# Patient Record
Sex: Female | Born: 1955 | Race: White | Hispanic: No | Marital: Married | Smoking: Never smoker
Health system: Southern US, Community
[De-identification: ages and names within clinical notes are randomized; demographics above are authoritative.]

## PROBLEM LIST (undated history)

## (undated) DIAGNOSIS — I214 Non-ST elevation (NSTEMI) myocardial infarction: Secondary | ICD-10-CM

## (undated) HISTORY — PX: COLONOSCOPY: SHX174

## (undated) HISTORY — PX: FOOT SURGERY: SHX648

## (undated) HISTORY — PX: EYE SURGERY: SHX253

## (undated) HISTORY — PX: APPENDECTOMY: SHX54

---

## 2017-07-29 ENCOUNTER — Encounter: Payer: Self-pay | Admitting: Gynecology

## 2017-07-29 ENCOUNTER — Ambulatory Visit
Admission: EM | Admit: 2017-07-29 | Discharge: 2017-07-29 | Disposition: A | Discharge status: Home / Self Care | Payer: 59 | Attending: Family Medicine | Admitting: Family Medicine

## 2017-07-29 ENCOUNTER — Other Ambulatory Visit: Payer: Self-pay

## 2017-07-29 DIAGNOSIS — Z20828 Contact with and (suspected) exposure to other viral communicable diseases: Secondary | ICD-10-CM | POA: Diagnosis not present

## 2017-07-29 DIAGNOSIS — R0981 Nasal congestion: Secondary | ICD-10-CM

## 2017-07-29 DIAGNOSIS — J069 Acute upper respiratory infection, unspecified: Secondary | ICD-10-CM | POA: Diagnosis not present

## 2017-07-29 LAB — RAPID INFLUENZA A&B ANTIGENS
Influenza A (ARMC): NEGATIVE
Influenza B (ARMC): NEGATIVE

## 2017-07-29 LAB — RAPID STREP SCREEN (MED CTR MEBANE ONLY): Streptococcus, Group A Screen (Direct): NEGATIVE

## 2017-07-29 MED ORDER — CETIRIZINE HCL 10 MG PO TABS: 10.0000 mg | ORAL_TABLET | Freq: Every day | ORAL | 1 refills | Status: DC

## 2017-07-29 MED ORDER — BENZONATATE 100 MG PO CAPS: 100.0000 mg | ORAL_CAPSULE | Freq: Three times a day (TID) | ORAL | 0 refills | Status: DC

## 2017-07-29 MED ORDER — OSELTAMIVIR PHOSPHATE 75 MG PO CAPS: 75.0000 mg | ORAL_CAPSULE | Freq: Two times a day (BID) | ORAL | 0 refills | Status: DC

## 2017-07-29 NOTE — ED Provider Notes (Signed)
[facility]    CSN: [id number] Arrival date & time: 07/29/17  1643     History   Chief Complaint Chief Complaint  Patient presents with  . Sore Throat    HPI Tiffany Mcconnell is a 62 y.o. female.   Patient is a 62 year old female who presents with complaint of cough, chest congestion, scratchy throat, fatigue and congestion around her eyes.  She started feeling some symptoms last night but also started this morning.  Patient reports her daughter-in-law was diagnosed with the flu yesterday.  She states they were closely together at work.  Patient denies any body aches or fevers.  She denies any ear issues, abdominal pain, and nausea.  Again she does report a little bit of chest tightness that she attributes to her cough.  Patient states she had some congestion last week and feels like it may have moved down with up into her chest.  She has taken some Aleve to help with her symptoms.  She denies any seasonal allergy issues.      History reviewed. No pertinent past medical history.  There are no active problems to display for this patient.   Past Surgical History:  Procedure Laterality Date  . APPENDECTOMY    . FOOT SURGERY Left     OB History    No data available       Home Medications    Prior to Admission medications   Medication Sig Start Date End Date Taking? Authorizing Provider  benzonatate (TESSALON) 100 MG capsule Take 1 capsule (100 mg total) by mouth every 8 (eight) hours. 07/29/17   Candis SchatzHarris, Michael D, PA-C  cetirizine (ZYRTEC) 10 MG tablet Take 1 tablet (10 mg total) by mouth daily. 07/29/17   Candis SchatzHarris, Michael D, PA-C  oseltamivir (TAMIFLU) 75 MG capsule Take 1 capsule (75 mg total) by mouth every 12 (twelve) hours. 07/29/17   Candis SchatzHarris, Michael D, PA-C    Family History Family History  Problem Relation Age of Onset  . Clotting disorder Mother   . Heart failure Father     Social History Social History   Tobacco Use  . Smoking status: Never  Smoker  . Smokeless tobacco: Never Used  Substance Use Topics  . Alcohol use: No    Frequency: Never  . Drug use: No     Allergies   Codeine and Vicodin [hydrocodone-acetaminophen]   Review of Systems Review of Systems  As noted above in HPI.  Other systems reviewed and found to be negative.   Physical Exam Triage Vital Signs ED Triage Vitals  Enc Vitals Group     BP 07/29/17 1704 132/76     Pulse Rate 07/29/17 1704 94     Resp 07/29/17 1704 16     Temp 07/29/17 1704 98.4 F (36.9 C)     Temp Source 07/29/17 1704 Oral     SpO2 07/29/17 1704 100 %     Weight 07/29/17 1659 148 lb (67.1 kg)     Height 07/29/17 1659 5\' 7"  (1.702 m)     Head Circumference --      Peak Flow --      Pain Score 07/29/17 1659 0     Pain Loc --      Pain Edu? --      Excl. in GC? --    No data found.  Updated Vital Signs BP 132/76 (BP Location: Left Arm)   Pulse 94   Temp 98.4 F (36.9 C) (Oral)   Resp  16   Ht 5\' 7"  (1.702 m)   Wt 148 lb (67.1 kg)   SpO2 100%   BMI 23.18 kg/m    Physical Exam  Constitutional: She appears well-developed and well-nourished. She does not appear ill.  HENT:  Right Ear: Tympanic membrane and ear canal normal.  Left Ear: Tympanic membrane and ear canal normal.  Nose: Mucosal edema present. Right sinus exhibits frontal sinus tenderness. Right sinus exhibits no maxillary sinus tenderness. Left sinus exhibits frontal sinus tenderness. Left sinus exhibits no maxillary sinus tenderness.  Mouth/Throat: Tonsils are 0 on the right. Tonsils are 0 on the left.  Mild clear postnasal drainage.  Eyes: EOM are normal. Pupils are equal, round, and reactive to light.  Neck: Normal range of motion. Neck supple.  Cardiovascular: Normal rate and normal heart sounds.  No murmur heard. Pulmonary/Chest: Effort normal and breath sounds normal. No respiratory distress. She has no wheezes.  No cough or wheezing on forced expiration     UC Treatments / Results   Labs (all labs ordered are listed, but only abnormal results are displayed) Labs Reviewed  RAPID STREP SCREEN (NOT AT [facility])  RAPID INFLUENZA A&B ANTIGENS ([facility] ONLY)  CULTURE, GROUP A STREP [facility])    EKG  EKG Interpretation None       Radiology No results found.  Procedures Procedures (including critical care time)  Medications Ordered in UC Medications - No data to display   Initial Impression / Assessment and Plan / UC Course  I have reviewed the triage vital signs and the nursing notes.  Pertinent labs & imaging results that were available during my care of the patient were reviewed by me and considered in my medical decision making (see chart for details).     On exam patient with mild postnasal drainage as well as some nasal edema.  She also has some frontal maxillary sinus tenderness.  Given her symptoms starting mostly this morning with her known contact, however go ahead and give her a prescription for Tamiflu and recommend that she can start it over the next day or so should her symptoms worsen, including coming in with fever, body aches, and chills.  Patient also given prescription for Zyrtec as well as Tessalon for her cough.  Patient verbalized understanding is agreement of plan.  Final Clinical Impressions(s) / UC Diagnoses   Final diagnoses:  Upper respiratory tract infection, unspecified type  Nasal sinus congestion    ED Discharge Orders        Ordered    cetirizine (ZYRTEC) 10 MG tablet  Daily     07/29/17 1803    oseltamivir (TAMIFLU) 75 MG capsule  Every 12 hours     07/29/17 1803    benzonatate (TESSALON) 100 MG capsule  Every 8 hours     07/29/17 1803       Controlled Substance Prescriptions [place] Controlled Substance Registry consulted? Not Applicable   Candis Schatz, PA-C 07/29/17 2014

## 2017-07-29 NOTE — Discharge Instructions (Signed)
-  Zyrtec: one tablet daily -tessalon: one every 8 hours as needed for cough -Tamiflu: can start in next day or two if symptoms worsen -rest and fluids -ibuprofen/naproxen or Tylenol as needed for pain and fever -follow up with PCP as needed

## 2017-07-29 NOTE — ED Triage Notes (Signed)
Patient c/o scratchy throat / cough and congestion.

## 2017-08-01 LAB — CULTURE, GROUP A STREP (THRC)

## 2018-08-05 ENCOUNTER — Encounter: Payer: Self-pay | Admitting: *Deleted

## 2018-08-05 ENCOUNTER — Other Ambulatory Visit: Payer: Self-pay

## 2018-08-12 NOTE — Discharge Instructions (Signed)

## 2018-08-18 ENCOUNTER — Encounter
Admission: RE | Disposition: A | Discharge status: Home / Self Care | Payer: Self-pay | Source: Home / Self Care | Attending: Ophthalmology

## 2018-08-18 ENCOUNTER — Other Ambulatory Visit: Payer: Self-pay

## 2018-08-18 ENCOUNTER — Ambulatory Visit: Payer: BLUE CROSS/BLUE SHIELD | Admitting: Anesthesiology

## 2018-08-18 ENCOUNTER — Ambulatory Visit
Admission: RE | Admit: 2018-08-18 | Discharge: 2018-08-18 | Disposition: A | Discharge status: Home / Self Care | Payer: BLUE CROSS/BLUE SHIELD | Attending: Ophthalmology | Admitting: Ophthalmology

## 2018-08-18 DIAGNOSIS — H2512 Age-related nuclear cataract, left eye: Secondary | ICD-10-CM | POA: Diagnosis not present

## 2018-08-18 HISTORY — PX: CATARACT EXTRACTION W/PHACO: SHX586

## 2018-08-18 SURGERY — PHACOEMULSIFICATION, CATARACT, WITH IOL INSERTION
Anesthesia: Monitor Anesthesia Care | Site: Eye | Laterality: Left

## 2018-08-18 MED ORDER — FENTANYL CITRATE (PF) 100 MCG/2ML IJ SOLN
INTRAMUSCULAR | Status: DC | PRN
  Administered 2018-08-18: 100 ug via INTRAVENOUS

## 2018-08-18 MED ORDER — CEFUROXIME OPHTHALMIC INJECTION 1 MG/0.1 ML
INJECTION | OPHTHALMIC | Status: DC | PRN
  Administered 2018-08-18: 0.1 mL via INTRACAMERAL

## 2018-08-18 MED ORDER — LIDOCAINE HCL (PF) 2 % IJ SOLN
INTRAOCULAR | Status: DC | PRN
  Administered 2018-08-18: 1 mL

## 2018-08-18 MED ORDER — ONDANSETRON HCL 4 MG/2ML IJ SOLN: 4.0000 mg | Freq: Once | INTRAMUSCULAR | Status: DC | PRN

## 2018-08-18 MED ORDER — MOXIFLOXACIN HCL 0.5 % OP SOLN
1.0000 [drp] | OPHTHALMIC | Status: DC | PRN
  Administered 2018-08-18 (×3): 1 [drp] via OPHTHALMIC

## 2018-08-18 MED ORDER — ONDANSETRON HCL 4 MG/2ML IJ SOLN
INTRAMUSCULAR | Status: DC | PRN
  Administered 2018-08-18: 4 mg via INTRAVENOUS

## 2018-08-18 MED ORDER — NA HYALUR & NA CHOND-NA HYALUR 0.4-0.35 ML IO KIT
PACK | INTRAOCULAR | Status: DC | PRN
  Administered 2018-08-18: 1 mL via INTRAOCULAR

## 2018-08-18 MED ORDER — EPINEPHRINE PF 1 MG/ML IJ SOLN
INTRAOCULAR | Status: DC | PRN
  Administered 2018-08-18: 51 mL via OPHTHALMIC

## 2018-08-18 MED ORDER — MIDAZOLAM HCL 2 MG/2ML IJ SOLN
INTRAMUSCULAR | Status: DC | PRN
  Administered 2018-08-18: 2 mg via INTRAVENOUS

## 2018-08-18 MED ORDER — ARMC OPHTHALMIC DILATING DROPS
1.0000 "application " | OPHTHALMIC | Status: DC | PRN
  Administered 2018-08-18 (×3): 1 via OPHTHALMIC

## 2018-08-18 MED ORDER — BRIMONIDINE TARTRATE-TIMOLOL 0.2-0.5 % OP SOLN
OPHTHALMIC | Status: DC | PRN
  Administered 2018-08-18: 1 [drp] via OPHTHALMIC

## 2018-08-18 MED ORDER — TETRACAINE HCL 0.5 % OP SOLN
1.0000 [drp] | OPHTHALMIC | Status: DC | PRN
  Administered 2018-08-18 (×3): 1 [drp] via OPHTHALMIC

## 2018-08-18 MED ORDER — LACTATED RINGERS IV SOLN: 10.0000 mL/h | INTRAVENOUS | Status: DC

## 2018-08-18 SURGICAL SUPPLY — 18 items
CANNULA ANT/CHMB 27GA (MISCELLANEOUS) ×3 IMPLANT
GLOVE SURG LX 7.5 STRW (GLOVE) ×2
GLOVE SURG LX STRL 7.5 STRW (GLOVE) ×1 IMPLANT
GLOVE SURG TRIUMPH 8.0 PF LTX (GLOVE) ×3 IMPLANT
GOWN STRL REUS W/ TWL LRG LVL3 (GOWN DISPOSABLE) ×2 IMPLANT
GOWN STRL REUS W/TWL LRG LVL3 (GOWN DISPOSABLE) ×4
LENS IOL TECNIS ITEC 22.0 (Intraocular Lens) ×3 IMPLANT
MARKER SKIN DUAL TIP RULER LAB (MISCELLANEOUS) ×3 IMPLANT
NEEDLE FILTER BLUNT 18X 1/2SAF (NEEDLE) ×2
NEEDLE FILTER BLUNT 18X1 1/2 (NEEDLE) ×1 IMPLANT
PACK CATARACT BRASINGTON (MISCELLANEOUS) ×3 IMPLANT
PACK EYE AFTER SURG (MISCELLANEOUS) ×3 IMPLANT
PACK OPTHALMIC (MISCELLANEOUS) ×3 IMPLANT
SYR 3ML LL SCALE MARK (SYRINGE) ×3 IMPLANT
SYR 5ML LL (SYRINGE) ×3 IMPLANT
SYR TB 1ML LUER SLIP (SYRINGE) ×3 IMPLANT
WATER STERILE IRR 500ML POUR (IV SOLUTION) ×3 IMPLANT
WIPE NON LINTING 3.25X3.25 (MISCELLANEOUS) ×3 IMPLANT

## 2018-08-18 NOTE — Transfer of Care (Signed)
Immediate Anesthesia Transfer of Care Note  Patient: Tiffany Mcconnell  Procedure(s) Performed: CATARACT EXTRACTION PHACO AND INTRAOCULAR LENS PLACEMENT (IOC)  LEFT (Left Eye)  Patient Location: PACU  Anesthesia Type: MAC  Level of Consciousness: awake, alert  and patient cooperative  Airway and Oxygen Therapy: Patient Spontanous Breathing and Patient connected to supplemental oxygen  Post-op Assessment: Post-op Vital signs reviewed, Patient's Cardiovascular Status Stable, Respiratory Function Stable, Patent Airway and No signs of Nausea or vomiting  Post-op Vital Signs: Reviewed and stable  Complications: No apparent anesthesia complications

## 2018-08-18 NOTE — Op Note (Signed)
OPERATIVE NOTE  Tiffany Mcconnell [id number] 08/18/2018   PREOPERATIVE DIAGNOSIS:  Nuclear sclerotic cataract left eye. H25.12   POSTOPERATIVE DIAGNOSIS:    Nuclear sclerotic cataract left eye.     PROCEDURE:  Phacoemusification with posterior chamber intraocular lens placement of the left eye   LENS:   Implant Name Type Inv. Item Serial No. Manufacturer Lot No. LRB No. Used  LENS IOL DIOP 22.0 - [id number] Intraocular Lens LENS IOL DIOP 22.0 [id number] AMO  Left 1        ULTRASOUND TIME: 17  % of 0 minutes 33 seconds, CDE 5.8  SURGEON:  Deirdre Evener, MD   ANESTHESIA:  Topical with tetracaine drops and 2% Xylocaine jelly, augmented with 1% preservative-free intracameral lidocaine.    COMPLICATIONS:  None.   DESCRIPTION OF PROCEDURE:  The patient was identified in the holding room and transported to the operating room and placed in the supine position under the operating microscope.  The left eye was identified as the operative eye and it was prepped and draped in the usual sterile ophthalmic fashion.   A 1 millimeter clear-corneal paracentesis was made at the 1:30 position.  0.5 ml of preservative-free 1% lidocaine was injected into the anterior chamber.  The anterior chamber was filled with Viscoat viscoelastic.  A 2.4 millimeter keratome was used to make a near-clear corneal incision at the 10:30 position.  .  A curvilinear capsulorrhexis was made with a cystotome and capsulorrhexis forceps.  Balanced salt solution was used to hydrodissect and hydrodelineate the nucleus.   Phacoemulsification was then used in stop and chop fashion to remove the lens nucleus and epinucleus.  The remaining cortex was then removed using the irrigation and aspiration handpiece. Provisc was then placed into the capsular bag to distend it for lens placement.  A lens was then injected into the capsular bag.  The remaining viscoelastic was aspirated.   Wounds were hydrated with balanced salt  solution.  The anterior chamber was inflated to a physiologic pressure with balanced salt solution.  No wound leaks were noted. Cefuroxime 0.1 ml of a 10mg /ml solution was injected into the anterior chamber for a dose of 1 mg of intracameral antibiotic at the completion of the case.   Timolol and Brimonidine drops were applied to the eye.  The patient was taken to the recovery room in stable condition without complications of anesthesia or surgery.  [name] 08/18/2018, 11:25 AM

## 2018-08-18 NOTE — Anesthesia Postprocedure Evaluation (Signed)
Anesthesia Post Note  Patient: Tiffany Mcconnell  Procedure(s) Performed: CATARACT EXTRACTION PHACO AND INTRAOCULAR LENS PLACEMENT (IOC)  LEFT (Left Eye)  Patient location during evaluation: PACU Anesthesia Type: MAC Level of consciousness: awake and alert and oriented Pain management: satisfactory to patient Vital Signs Assessment: post-procedure vital signs reviewed and stable Respiratory status: spontaneous breathing, nonlabored ventilation and respiratory function stable Cardiovascular status: blood pressure returned to baseline and stable Postop Assessment: Adequate PO intake and No signs of nausea or vomiting Anesthetic complications: no    Raliegh Ip

## 2018-08-18 NOTE — Anesthesia Procedure Notes (Signed)
Procedure Name: MAC Performed by: Izetta Dakin, CRNA Pre-anesthesia Checklist: Timeout performed, Patient being monitored, Suction available, Emergency Drugs available and Patient identified Patient Re-evaluated:Patient Re-evaluated prior to induction Oxygen Delivery Method: Nasal cannula

## 2018-08-18 NOTE — H&P (Signed)

## 2018-08-18 NOTE — Anesthesia Preprocedure Evaluation (Signed)
Anesthesia Evaluation  Patient identified by MRN, date of birth, ID band Patient awake    Reviewed: Allergy & Precautions, H&P , NPO status , Patient's Chart, lab work & pertinent test results  Airway Mallampati: II  TM Distance: >3 FB Neck ROM: full    Dental no notable dental hx.    Pulmonary    Pulmonary exam normal breath sounds clear to auscultation       Cardiovascular Normal cardiovascular exam Rhythm:regular Rate:Normal     Neuro/Psych    GI/Hepatic   Endo/Other    Renal/GU      Musculoskeletal   Abdominal   Peds  Hematology   Anesthesia Other Findings   Reproductive/Obstetrics                             Anesthesia Physical Anesthesia Plan  ASA: I  Anesthesia Plan: MAC   Post-op Pain Management:    Induction:   PONV Risk Score and Plan: 2 and Treatment may vary due to age or medical condition and Midazolam  Airway Management Planned:   Additional Equipment:   Intra-op Plan:   Post-operative Plan:   Informed Consent: I have reviewed the patients History and Physical, chart, labs and discussed the procedure including the risks, benefits and alternatives for the proposed anesthesia with the patient or authorized representative who has indicated his/her understanding and acceptance.       Plan Discussed with: CRNA  Anesthesia Plan Comments:         Anesthesia Quick Evaluation

## 2018-11-11 ENCOUNTER — Other Ambulatory Visit: Payer: Self-pay

## 2018-11-11 ENCOUNTER — Encounter: Payer: Self-pay | Admitting: *Deleted

## 2018-11-11 NOTE — Discharge Instructions (Signed)

## 2018-11-12 ENCOUNTER — Other Ambulatory Visit
Admission: RE | Admit: 2018-11-12 | Discharge: 2018-11-12 | Disposition: A | Discharge status: Home / Self Care | Payer: BC Managed Care – PPO | Source: Ambulatory Visit | Attending: Ophthalmology | Admitting: Ophthalmology

## 2018-11-12 DIAGNOSIS — Z01812 Encounter for preprocedural laboratory examination: Secondary | ICD-10-CM | POA: Diagnosis present

## 2018-11-12 DIAGNOSIS — Z1159 Encounter for screening for other viral diseases: Secondary | ICD-10-CM | POA: Diagnosis not present

## 2018-11-13 LAB — NOVEL CORONAVIRUS, NAA (HOSP ORDER, SEND-OUT TO REF LAB; TAT 18-24 HRS): SARS-CoV-2, NAA: NOT DETECTED

## 2018-11-17 ENCOUNTER — Encounter: Payer: Self-pay | Admitting: Anesthesiology

## 2018-11-17 ENCOUNTER — Encounter
Admission: RE | Disposition: A | Discharge status: Home / Self Care | Payer: Self-pay | Source: Ambulatory Visit | Attending: Ophthalmology

## 2018-11-17 ENCOUNTER — Ambulatory Visit: Payer: BC Managed Care – PPO | Admitting: Anesthesiology

## 2018-11-17 ENCOUNTER — Ambulatory Visit
Admission: RE | Admit: 2018-11-17 | Discharge: 2018-11-17 | Disposition: A | Discharge status: Home / Self Care | Payer: BC Managed Care – PPO | Source: Ambulatory Visit | Attending: Ophthalmology | Admitting: Ophthalmology

## 2018-11-17 DIAGNOSIS — H2511 Age-related nuclear cataract, right eye: Secondary | ICD-10-CM | POA: Insufficient documentation

## 2018-11-17 DIAGNOSIS — R011 Cardiac murmur, unspecified: Secondary | ICD-10-CM | POA: Insufficient documentation

## 2018-11-17 DIAGNOSIS — Z885 Allergy status to narcotic agent status: Secondary | ICD-10-CM | POA: Diagnosis not present

## 2018-11-17 DIAGNOSIS — Z882 Allergy status to sulfonamides status: Secondary | ICD-10-CM | POA: Diagnosis not present

## 2018-11-17 HISTORY — PX: CATARACT EXTRACTION W/PHACO: SHX586

## 2018-11-17 SURGERY — PHACOEMULSIFICATION, CATARACT, WITH IOL INSERTION
Anesthesia: Monitor Anesthesia Care | Site: Eye | Laterality: Right

## 2018-11-17 MED ORDER — NA HYALUR & NA CHOND-NA HYALUR 0.4-0.35 ML IO KIT
PACK | INTRAOCULAR | Status: DC | PRN
  Administered 2018-11-17: 1 mL via INTRAOCULAR

## 2018-11-17 MED ORDER — FENTANYL CITRATE (PF) 100 MCG/2ML IJ SOLN
INTRAMUSCULAR | Status: DC | PRN
  Administered 2018-11-17: 100 ug via INTRAVENOUS

## 2018-11-17 MED ORDER — ARMC OPHTHALMIC DILATING DROPS
1.0000 "application " | OPHTHALMIC | Status: DC | PRN
  Administered 2018-11-17 (×3): 1 via OPHTHALMIC

## 2018-11-17 MED ORDER — MOXIFLOXACIN HCL 0.5 % OP SOLN
1.0000 [drp] | OPHTHALMIC | Status: DC | PRN
  Administered 2018-11-17 (×3): 1 [drp] via OPHTHALMIC

## 2018-11-17 MED ORDER — ONDANSETRON HCL 4 MG/2ML IJ SOLN
INTRAMUSCULAR | Status: DC | PRN
  Administered 2018-11-17: 4 mg via INTRAVENOUS

## 2018-11-17 MED ORDER — CEFUROXIME OPHTHALMIC INJECTION 1 MG/0.1 ML
INJECTION | OPHTHALMIC | Status: DC | PRN
  Administered 2018-11-17: 0.1 mL via INTRACAMERAL

## 2018-11-17 MED ORDER — LIDOCAINE HCL (PF) 2 % IJ SOLN
INTRAOCULAR | Status: DC | PRN
  Administered 2018-11-17: 1 mL

## 2018-11-17 MED ORDER — EPINEPHRINE PF 1 MG/ML IJ SOLN
INTRAOCULAR | Status: DC | PRN
  Administered 2018-11-17: 61 mL via OPHTHALMIC

## 2018-11-17 MED ORDER — TETRACAINE HCL 0.5 % OP SOLN
1.0000 [drp] | OPHTHALMIC | Status: DC | PRN
  Administered 2018-11-17 (×3): 1 [drp] via OPHTHALMIC

## 2018-11-17 MED ORDER — BRIMONIDINE TARTRATE-TIMOLOL 0.2-0.5 % OP SOLN
OPHTHALMIC | Status: DC | PRN
  Administered 2018-11-17: 1 [drp] via OPHTHALMIC

## 2018-11-17 MED ORDER — MIDAZOLAM HCL 2 MG/2ML IJ SOLN
INTRAMUSCULAR | Status: DC | PRN
  Administered 2018-11-17: 2 mg via INTRAVENOUS

## 2018-11-17 SURGICAL SUPPLY — 20 items
CANNULA ANT/CHMB 27G (MISCELLANEOUS) ×1 IMPLANT
CANNULA ANT/CHMB 27GA (MISCELLANEOUS) ×3 IMPLANT
GLOVE SURG LX 7.5 STRW (GLOVE) ×2
GLOVE SURG LX STRL 7.5 STRW (GLOVE) ×1 IMPLANT
GLOVE SURG TRIUMPH 8.0 PF LTX (GLOVE) ×3 IMPLANT
GOWN STRL REUS W/ TWL LRG LVL3 (GOWN DISPOSABLE) ×2 IMPLANT
GOWN STRL REUS W/TWL LRG LVL3 (GOWN DISPOSABLE) ×4
LENS IOL TECNIS ITEC 19.0 (Intraocular Lens) ×2 IMPLANT
MARKER SKIN DUAL TIP RULER LAB (MISCELLANEOUS) ×3 IMPLANT
NDL FILTER BLUNT 18X1 1/2 (NEEDLE) ×1 IMPLANT
NEEDLE FILTER BLUNT 18X 1/2SAF (NEEDLE) ×2
NEEDLE FILTER BLUNT 18X1 1/2 (NEEDLE) ×1 IMPLANT
PACK CATARACT BRASINGTON (MISCELLANEOUS) ×3 IMPLANT
PACK EYE AFTER SURG (MISCELLANEOUS) ×3 IMPLANT
PACK OPTHALMIC (MISCELLANEOUS) ×3 IMPLANT
SYR 3ML LL SCALE MARK (SYRINGE) ×3 IMPLANT
SYR 5ML LL (SYRINGE) ×3 IMPLANT
SYR TB 1ML LUER SLIP (SYRINGE) ×3 IMPLANT
WATER STERILE IRR 500ML POUR (IV SOLUTION) ×3 IMPLANT
WIPE NON LINTING 3.25X3.25 (MISCELLANEOUS) ×3 IMPLANT

## 2018-11-17 NOTE — Op Note (Signed)
LOCATION:  [facility]   PREOPERATIVE DIAGNOSIS:    Nuclear sclerotic cataract right eye. H25.11   POSTOPERATIVE DIAGNOSIS:  Nuclear sclerotic cataract right eye.     PROCEDURE:  Phacoemusification with posterior chamber intraocular lens placement of the right eye   LENS:   Implant Name Type Inv. Item Serial No. Manufacturer Lot No. LRB No. Used Action  LENS IOL DIOP 19.0 - [id number] Intraocular Lens LENS IOL DIOP 19.0 [id number] AMO  Right 1 Implanted        ULTRASOUND TIME: 12 % of 0 minutes, 50 seconds.  CDE 6.1   SURGEON:  Wyonia Hough, MD   ANESTHESIA:  Topical with tetracaine drops and 2% Xylocaine jelly, augmented with 1% preservative-free intracameral lidocaine.    COMPLICATIONS:  None.   DESCRIPTION OF PROCEDURE:  The patient was identified in the holding room and transported to the operating room and placed in the supine position under the operating microscope.  The right eye was identified as the operative eye and it was prepped and draped in the usual sterile ophthalmic fashion.   A 1 millimeter clear-corneal paracentesis was made at the 12:00 position.  0.5 ml of preservative-free 1% lidocaine was injected into the anterior chamber. The anterior chamber was filled with Viscoat viscoelastic.  A 2.4 millimeter keratome was used to make a near-clear corneal incision at the 9:00 position.  A curvilinear capsulorrhexis was made with a cystotome and capsulorrhexis forceps.  Balanced salt solution was used to hydrodissect and hydrodelineate the nucleus.   Phacoemulsification was then used in stop and chop fashion to remove the lens nucleus and epinucleus.  The remaining cortex was then removed using the irrigation and aspiration handpiece. Provisc was then placed into the capsular bag to distend it for lens placement.  A lens was then injected into the capsular bag.  The remaining viscoelastic was aspirated.   Wounds were hydrated with balanced salt solution.   The anterior chamber was inflated to a physiologic pressure with balanced salt solution.  No wound leaks were noted. Cefuroxime 0.1 ml of a 10mg /ml solution was injected into the anterior chamber for a dose of 1 mg of intracameral antibiotic at the completion of the case.   Timolol and Brimonidine drops were applied to the eye.  The patient was taken to the recovery room in stable condition without complications of anesthesia or surgery.   Blanca Carreon 11/17/2018, 9:32 AM

## 2018-11-17 NOTE — Transfer of Care (Signed)
Immediate Anesthesia Transfer of Care Note  Patient: Tiffany Mcconnell  Procedure(s) Performed: CATARACT EXTRACTION PHACO AND INTRAOCULAR LENS PLACEMENT (IOC) RIGHT (Right Eye)  Patient Location: PACU  Anesthesia Type: MAC  Level of Consciousness: awake, alert  and patient cooperative  Airway and Oxygen Therapy: Patient Spontanous Breathing and Patient connected to supplemental oxygen  Post-op Assessment: Post-op Vital signs reviewed, Patient's Cardiovascular Status Stable, Respiratory Function Stable, Patent Airway and No signs of Nausea or vomiting  Post-op Vital Signs: Reviewed and stable  Complications: No apparent anesthesia complications

## 2018-11-17 NOTE — H&P (Signed)

## 2018-11-17 NOTE — Anesthesia Preprocedure Evaluation (Signed)
Anesthesia Evaluation  Patient identified by MRN, date of birth, ID band  Reviewed: NPO status   History of Anesthesia Complications Negative for: history of anesthetic complications  Airway Mallampati: II  TM Distance: >3 FB Neck ROM: full    Dental no notable dental hx.    Pulmonary neg pulmonary ROS,    Pulmonary exam normal        Cardiovascular negative cardio ROS Normal cardiovascular exam     Neuro/Psych negative neurological ROS  negative psych ROS   GI/Hepatic negative GI ROS, Neg liver ROS,   Endo/Other  negative endocrine ROS  Renal/GU negative Renal ROS  negative genitourinary   Musculoskeletal   Abdominal   Peds  Hematology negative hematology ROS (+)   Anesthesia Other Findings Covid; NEG  Reproductive/Obstetrics                             Anesthesia Physical Anesthesia Plan  ASA: I  Anesthesia Plan: MAC   Post-op Pain Management:    Induction:   PONV Risk Score and Plan:   Airway Management Planned:   Additional Equipment:   Intra-op Plan:   Post-operative Plan:   Informed Consent: I have reviewed the patients History and Physical, chart, labs and discussed the procedure including the risks, benefits and alternatives for the proposed anesthesia with the patient or authorized representative who has indicated his/her understanding and acceptance.       Plan Discussed with: CRNA  Anesthesia Plan Comments:         Anesthesia Quick Evaluation

## 2018-11-17 NOTE — Anesthesia Procedure Notes (Signed)
Procedure Name: MAC Date/Time: 11/17/2018 9:10 AM Performed by: Cameron Ali, CRNA Pre-anesthesia Checklist: Patient identified, Emergency Drugs available, Suction available, Timeout performed and Patient being monitored Patient Re-evaluated:Patient Re-evaluated prior to induction Oxygen Delivery Method: Nasal cannula Placement Confirmation: positive ETCO2

## 2018-11-17 NOTE — Anesthesia Postprocedure Evaluation (Signed)
Anesthesia Post Note  Patient: Tiffany Mcconnell  Procedure(s) Performed: CATARACT EXTRACTION PHACO AND INTRAOCULAR LENS PLACEMENT (IOC) RIGHT (Right Eye)  Patient location during evaluation: PACU Anesthesia Type: MAC Level of consciousness: awake and alert Pain management: pain level controlled Vital Signs Assessment: post-procedure vital signs reviewed and stable Respiratory status: spontaneous breathing, nonlabored ventilation, respiratory function stable and patient connected to nasal cannula oxygen Cardiovascular status: stable and blood pressure returned to baseline Postop Assessment: no apparent nausea or vomiting Anesthetic complications: no    Oni Dietzman

## 2018-11-18 ENCOUNTER — Encounter: Payer: Self-pay | Admitting: Ophthalmology

## 2020-09-10 ENCOUNTER — Encounter: Payer: Self-pay | Admitting: Emergency Medicine

## 2020-09-10 ENCOUNTER — Ambulatory Visit
Admission: EM | Admit: 2020-09-10 | Discharge: 2020-09-10 | Disposition: A | Discharge status: Home / Self Care | Payer: Medicare Other | Attending: Physician Assistant | Admitting: Physician Assistant

## 2020-09-10 ENCOUNTER — Other Ambulatory Visit: Payer: Self-pay

## 2020-09-10 DIAGNOSIS — R110 Nausea: Secondary | ICD-10-CM

## 2020-09-10 DIAGNOSIS — K529 Noninfective gastroenteritis and colitis, unspecified: Secondary | ICD-10-CM

## 2020-09-10 DIAGNOSIS — R197 Diarrhea, unspecified: Secondary | ICD-10-CM

## 2020-09-10 DIAGNOSIS — N3001 Acute cystitis with hematuria: Secondary | ICD-10-CM

## 2020-09-10 DIAGNOSIS — N179 Acute kidney failure, unspecified: Secondary | ICD-10-CM

## 2020-09-10 DIAGNOSIS — E860 Dehydration: Secondary | ICD-10-CM

## 2020-09-10 LAB — URINALYSIS, COMPLETE (UACMP) WITH MICROSCOPIC
Glucose, UA: NEGATIVE mg/dL
Nitrite: POSITIVE — AB
Protein, ur: 30 mg/dL — AB
Specific Gravity, Urine: 1.02 (ref 1.005–1.030)
pH: 5.5 (ref 5.0–8.0)

## 2020-09-10 LAB — CBC WITH DIFFERENTIAL/PLATELET
Abs Immature Granulocytes: 0.01 10*3/uL (ref 0.00–0.07)
Basophils Absolute: 0 10*3/uL (ref 0.0–0.1)
Basophils Relative: 1 %
Eosinophils Absolute: 0 10*3/uL (ref 0.0–0.5)
Eosinophils Relative: 0 %
HCT: 41.2 % (ref 36.0–46.0)
Hemoglobin: 13.9 g/dL (ref 12.0–15.0)
Immature Granulocytes: 0 %
Lymphocytes Relative: 30 %
Lymphs Abs: 1.1 10*3/uL (ref 0.7–4.0)
MCH: 31.2 pg (ref 26.0–34.0)
MCHC: 33.7 g/dL (ref 30.0–36.0)
MCV: 92.6 fL (ref 80.0–100.0)
Monocytes Absolute: 0.4 10*3/uL (ref 0.1–1.0)
Monocytes Relative: 10 %
Neutro Abs: 2.1 10*3/uL (ref 1.7–7.7)
Neutrophils Relative %: 59 %
Platelets: 141 10*3/uL — ABNORMAL LOW (ref 150–400)
RBC: 4.45 MIL/uL (ref 3.87–5.11)
RDW: 12.9 % (ref 11.5–15.5)
Smear Review: NORMAL
WBC: 3.6 10*3/uL — ABNORMAL LOW (ref 4.0–10.5)
nRBC: 0 % (ref 0.0–0.2)

## 2020-09-10 LAB — BASIC METABOLIC PANEL
Anion gap: 7 (ref 5–15)
BUN: 19 mg/dL (ref 8–23)
CO2: 22 mmol/L (ref 22–32)
Calcium: 8.5 mg/dL — ABNORMAL LOW (ref 8.9–10.3)
Chloride: 107 mmol/L (ref 98–111)
Creatinine, Ser: 1.08 mg/dL — ABNORMAL HIGH (ref 0.44–1.00)
GFR, Estimated: 57 mL/min — ABNORMAL LOW (ref >60)
Glucose, Bld: 91 mg/dL (ref 70–99)
Potassium: 3.2 mmol/L — ABNORMAL LOW (ref 3.5–5.1)
Sodium: 136 mmol/L (ref 135–145)

## 2020-09-10 LAB — COMPREHENSIVE METABOLIC PANEL
ALT: 29 U/L (ref 0–44)
AST: 84 U/L — ABNORMAL HIGH (ref 15–41)
Albumin: 4.3 g/dL (ref 3.5–5.0)
Alkaline Phosphatase: 77 U/L (ref 38–126)
Anion gap: 11 (ref 5–15)
BUN: 20 mg/dL (ref 8–23)
CO2: 22 mmol/L (ref 22–32)
Calcium: 9.3 mg/dL (ref 8.9–10.3)
Chloride: 103 mmol/L (ref 98–111)
Creatinine, Ser: 1.35 mg/dL — ABNORMAL HIGH (ref 0.44–1.00)
GFR, Estimated: 44 mL/min — ABNORMAL LOW (ref >60)
Glucose, Bld: 104 mg/dL — ABNORMAL HIGH (ref 70–99)
Potassium: 3.1 mmol/L — ABNORMAL LOW (ref 3.5–5.1)
Sodium: 136 mmol/L (ref 135–145)
Total Bilirubin: 0.6 mg/dL (ref 0.3–1.2)
Total Protein: 8 g/dL (ref 6.5–8.1)

## 2020-09-10 MED ORDER — ONDANSETRON 4 MG PO TBDP: 4.0000 mg | ORAL_TABLET | Freq: Four times a day (QID) | ORAL | 0 refills | Status: AC | PRN

## 2020-09-10 MED ORDER — SODIUM CHLORIDE 0.9 % IV BOLUS
1000.0000 mL | Freq: Once | INTRAVENOUS | Status: AC
  Administered 2020-09-10: 1000 mL via INTRAVENOUS

## 2020-09-10 MED ORDER — CEPHALEXIN 500 MG PO CAPS: 500.0000 mg | ORAL_CAPSULE | Freq: Two times a day (BID) | ORAL | 0 refills | Status: AC

## 2020-09-10 NOTE — Discharge Instructions (Addendum)
ABDOMINAL PAIN: You may take Tylenol for pain relief. Use medications as directed including antiemetics and antidiarrheal medications if suggested or prescribed. You should increase fluids and electrolytes as well as rest over these next several days. If you have any questions or concerns, or if your symptoms are not improving or if especially if they acutely worsen, please call or stop back to the clinic immediately and we will be happy to help you or go to the ER   ABDOMINAL PAIN RED FLAGS: Seek immediate further care if: symptoms remain the same or worsen over the next 3-7 days, you are unable to keep fluids down, you see blood or mucus in your stool, you vomit black or dark red material, you have a fever of 101.F or higher, you have localized and/or persistent abdominal pain    UTI: Based on either symptoms or urinalysis, you may have a urinary tract infection. We will send the urine for culture and call with results in a few days. Begin antibiotics at this time. Your symptoms should be much improved over the next 2-3 days. Increase rest and fluid intake. If for some reason symptoms are worsening or not improving after a couple of days or the urine culture determines the antibiotics you are taking will not treat the infection, the antibiotics may be changed. Return or go to ER for fever, back pain, worsening urinary pain, discharge, increased blood in urine. May take Tylenol or Motrin OTC for pain relief or consider AZO if no contraindications

## 2020-09-10 NOTE — ED Provider Notes (Signed)
[facility]    CSN: [id number] Arrival date & time: 09/10/20  3664      History   Chief Complaint Chief Complaint  Patient presents with  . Diarrhea    HPI Tiffany Mcconnell is a 65 y.o. female presenting for 4-day history of diarrhea and nausea with occasional dry heaving.  She states that she had some vomiting on the first day but has not continued to have any vomiting.  She says that she has been trying to drink enough water but is afraid to since she has diarrhea almost immediately after she drinks fluids.  Patient concerned that she may be dehydrated that she has had continued fatigue.  Patient states that she has been in bed for about 3 days.  She says that she had to make herself get up and get ready today but it was difficult.  She has had some abdominal cramping that is been mild.  Admits to reduced appetite and says she lost 6 pounds in the past few days.  No fever, cough, congestion, sore throat, chest pain, breathing difficulty, dysuria or urinary frequency.  Patient states that a family member recently had a stomach virus and she believes she may have picked it up from her.  She has not been taking any over-the-counter medication for symptoms.  She says that she would like to feel better and get back to work.  She says she is taken at home Covid test which been negative.  No other complaints or concerns.  HPI  History reviewed. No pertinent past medical history.  There are no problems to display for this patient.   Past Surgical History:  Procedure Laterality Date  . APPENDECTOMY    . CATARACT EXTRACTION W/PHACO Left 08/18/2018   Procedure: CATARACT EXTRACTION PHACO AND INTRAOCULAR LENS PLACEMENT (IOC)  LEFT;  Surgeon: Lockie Mola, MD;  Location: [facility];  Service: Ophthalmology;  Laterality: Left;  . CATARACT EXTRACTION W/PHACO Right 11/17/2018   Procedure: CATARACT EXTRACTION PHACO AND INTRAOCULAR LENS PLACEMENT (IOC) RIGHT;  Surgeon:  Lockie Mola, MD;  Location: [facility];  Service: Ophthalmology;  Laterality: Right;  . COLONOSCOPY    . EYE SURGERY     eyelid  . FOOT SURGERY Left     OB History   No obstetric history on file.      Home Medications    Prior to Admission medications   Medication Sig Start Date End Date Taking? Authorizing Provider  aspirin 81 MG chewable tablet Chew by mouth. 09/13/19  Yes [provider]  atorvastatin (LIPITOR) 80 MG tablet Take 1 tablet by mouth daily. 09/12/19  Yes [provider]  cephALEXin (KEFLEX) 500 MG capsule Take 1 capsule (500 mg total) by mouth 2 (two) times daily for 7 days. 09/10/20 09/17/20 Yes Eusebio Friendly B, PA-C  metoprolol succinate (TOPROL-XL) 25 MG 24 hr tablet Take 0.5 tablets by mouth daily. 09/13/19  Yes [provider]  Multiple Vitamin (MULTIVITAMIN) tablet Take 1 tablet by mouth daily.   Yes [provider]  ondansetron (ZOFRAN ODT) 4 MG disintegrating tablet Take 1 tablet (4 mg total) by mouth every 6 (six) hours as needed for up to 5 days for nausea or vomiting. 09/10/20 09/15/20 Yes Shirlee Latch, PA-C  Multiple Vitamins-Minerals (PRESERVISION AREDS 2 PO) Take by mouth.    [provider]    Family History Family History  Problem Relation Age of Onset  . Clotting disorder Mother   . Heart failure Father  Social History Social History   Tobacco Use  . Smoking status: Never Smoker  . Smokeless tobacco: Never Used  Vaping Use  . Vaping Use: Never used  Substance Use Topics  . Alcohol use: No  . Drug use: No     Allergies   Codeine, Darvon [propoxyphene], Sulfa antibiotics, and Vicodin [hydrocodone-acetaminophen]   Review of Systems Review of Systems  Constitutional: Positive for appetite change. Negative for chills, diaphoresis, fatigue and fever.  HENT: Negative for congestion, rhinorrhea and sore throat.   Respiratory: Negative for cough and shortness of breath.    Gastrointestinal: Positive for abdominal pain (mild cramping), diarrhea and nausea. Negative for vomiting.  Genitourinary: Positive for decreased urine volume. Negative for dysuria and frequency.  Musculoskeletal: Negative for arthralgias and myalgias.  Skin: Negative for rash.  Neurological: Negative for weakness and headaches.     Physical Exam Triage Vital Signs ED Triage Vitals  Enc Vitals Group     BP 09/10/20 0947 124/87     Pulse Rate 09/10/20 0947 79     Resp 09/10/20 0947 18     Temp 09/10/20 0947 98.4 F (36.9 C)     Temp Source 09/10/20 0947 Oral     SpO2 09/10/20 0947 100 %     Weight 09/10/20 0945 153 lb (69.4 kg)     Height 09/10/20 0945 5\' 6"  (1.676 m)     Head Circumference --      Peak Flow --      Pain Score 09/10/20 0945 5     Pain Loc --      Pain Edu? --      Excl. in GC? --    No data found.  Updated Vital Signs BP 124/87 (BP Location: Right Arm)   Pulse 79   Temp 98.4 F (36.9 C) (Oral)   Resp 18   Ht 5\' 6"  (1.676 m)   Wt 153 lb (69.4 kg)   SpO2 100%   BMI 24.69 kg/m       Physical Exam Vitals and nursing note reviewed.  Constitutional:      General: She is not in acute distress.    Appearance: Normal appearance. She is not ill-appearing or toxic-appearing.  HENT:     Head: Normocephalic and atraumatic.     Nose: Nose normal.     Mouth/Throat:     Mouth: Mucous membranes are moist.     Pharynx: Oropharynx is clear.  Eyes:     General: No scleral icterus.       Right eye: No discharge.        Left eye: No discharge.     Conjunctiva/sclera: Conjunctivae normal.  Cardiovascular:     Rate and Rhythm: Normal rate and regular rhythm.     Heart sounds: Normal heart sounds.  Pulmonary:     Effort: Pulmonary effort is normal. No respiratory distress.     Breath sounds: Normal breath sounds.  Abdominal:     General: Abdomen is flat. Bowel sounds are normal.     Palpations: Abdomen is soft.     Tenderness: There is abdominal  tenderness in the periumbilical area.  Musculoskeletal:     Cervical back: Neck supple.  Skin:    General: Skin is dry.  Neurological:     General: No focal deficit present.     Mental Status: She is alert. Mental status is at baseline.     Motor: No weakness.     Gait: Gait normal.  Psychiatric:  Mood and Affect: Mood normal.        Behavior: Behavior normal.        Thought Content: Thought content normal.      UC Treatments / Results  Labs (all labs ordered are listed, but only abnormal results are displayed) Labs Reviewed  URINALYSIS, COMPLETE (UACMP) WITH MICROSCOPIC - Abnormal; Notable for the following components:      Result Value   APPearance HAZY (*)    Hgb urine dipstick MODERATE (*)    Bilirubin Urine SMALL (*)    Ketones, ur TRACE (*)    Protein, ur 30 (*)    Nitrite POSITIVE (*)    Leukocytes,Ua TRACE (*)    Bacteria, UA MANY (*)    All other components within normal limits  CBC WITH DIFFERENTIAL/PLATELET - Abnormal; Notable for the following components:   WBC 3.6 (*)    Platelets 141 (*)    All other components within normal limits  COMPREHENSIVE METABOLIC PANEL - Abnormal; Notable for the following components:   Potassium 3.1 (*)    Glucose, Bld 104 (*)    Creatinine, Ser 1.35 (*)    AST 84 (*)    GFR, Estimated 44 (*)    All other components within normal limits  BASIC METABOLIC PANEL - Abnormal; Notable for the following components:   Potassium 3.2 (*)    Creatinine, Ser 1.08 (*)    Calcium 8.5 (*)    GFR, Estimated 57 (*)    All other components within normal limits  URINE CULTURE  URINE CULTURE    EKG   Radiology No results found.  Procedures Procedures (including critical care time)  Medications Ordered in UC Medications  sodium chloride 0.9 % bolus 1,000 mL (0 mLs Intravenous Stopped 09/10/20 1244)    Initial Impression / Assessment and Plan / UC Course  I have reviewed the triage vital signs and the nursing  notes.  Pertinent labs & imaging results that were available during my care of the patient were reviewed by me and considered in my medical decision making (see chart for details).   65 year old female presenting for diarrhea, fatigue and nausea x4 days.  Patient concern for dehydration that she has not been able to adequately hydrate.  Vital signs are all stable.  Her blood pressure is 124/87.  She is afebrile.  Exam significant for mild periumbilical tenderness, otherwise normal.  Urinalysis shows hazy dark appearance, moderate blood, small bili, trace ketones, protein, trace leukocytes, positive nitrites and many bacteria.  We will send urine for culture.  Patient denies any urinary symptoms.  Patient started on normal saline bolus and CBC and CMP drawn.  CBC shows slightly decreased white blood cell count of 3.6.  CMP shows potassium decreased at 3.1, AST elevated 84, creatinine elevated at 1.35 and GFR decreased to 44.  Review of BMP 1 year ago shows creatinine of 0.9 and potassium of 4.3.  His likely that her elevated creatinine decrease potassium due to GI losses and dehydration.  Will recheck BMP after fluid bolus.  BMP shows creatinine of 1.08 and potassium of 3.2.  So, increase in potassium due to dehydration/ acute kidney injury since it has improved with the fluids.  Advised her of importance of continuing fluid hydration at home.  Advised patient that she likely has stomach virus causing her diarrhea and nausea symptoms.  Advised her that complicating her situation is a UTI.  We will treat the UTI with Keflex.  I have sent Zofran for  the nausea.  Encouraged her to increase her rest and fluids.  Suggested Pedialyte.  Reviewed ED red flag signs and symptoms regarding UTIs and gastroenteritis with patient.  Final Clinical Impressions(s) / UC Diagnoses   Final diagnoses:  Gastroenteritis  Diarrhea, unspecified type  Dehydration  Acute cystitis with hematuria  Nausea  Acute  kidney injury [facility])     Discharge Instructions     ABDOMINAL PAIN: You may take Tylenol for pain relief. Use medications as directed including antiemetics and antidiarrheal medications if suggested or prescribed. You should increase fluids and electrolytes as well as rest over these next several days. If you have any questions or concerns, or if your symptoms are not improving or if especially if they acutely worsen, please call or stop back to the clinic immediately and we will be happy to help you or go to the ER   ABDOMINAL PAIN RED FLAGS: Seek immediate further care if: symptoms remain the same or worsen over the next 3-7 days, you are unable to keep fluids down, you see blood or mucus in your stool, you vomit black or dark red material, you have a fever of 101.F or higher, you have localized and/or persistent abdominal pain    UTI: Based on either symptoms or urinalysis, you may have a urinary tract infection. We will send the urine for culture and call with results in a few days. Begin antibiotics at this time. Your symptoms should be much improved over the next 2-3 days. Increase rest and fluid intake. If for some reason symptoms are worsening or not improving after a couple of days or the urine culture determines the antibiotics you are taking will not treat the infection, the antibiotics may be changed. Return or go to ER for fever, back pain, worsening urinary pain, discharge, increased blood in urine. May take Tylenol or Motrin OTC for pain relief or consider AZO if no contraindications     ED Prescriptions    Medication Sig Dispense Auth. Provider   cephALEXin (KEFLEX) 500 MG capsule Take 1 capsule (500 mg total) by mouth 2 (two) times daily for 7 days. 14 capsule Eusebio Friendly B, PA-C   ondansetron (ZOFRAN ODT) 4 MG disintegrating tablet Take 1 tablet (4 mg total) by mouth every 6 (six) hours as needed for up to 5 days for nausea or vomiting. 20 tablet Gareth Morgan     PDMP  not reviewed this encounter.   Shirlee Latch, PA-C 09/10/20 1333

## 2020-09-10 NOTE — ED Triage Notes (Addendum)
Patient states her granddaughter was diagnosed with Norovirus. She states on Thursday she started having diarrhea and vomiting. She last vomited Thursday. She states she has continued to have diarrhea. Last episode was at 2am this morning.

## 2020-09-13 LAB — URINE CULTURE: Culture: >=100000 — AB

## 2020-12-16 ENCOUNTER — Other Ambulatory Visit: Payer: Self-pay

## 2020-12-16 ENCOUNTER — Ambulatory Visit
Admission: EM | Admit: 2020-12-16 | Discharge: 2020-12-16 | Disposition: A | Discharge status: Home / Self Care | Payer: Medicare Other | Attending: Family Medicine | Admitting: Family Medicine

## 2020-12-16 DIAGNOSIS — N390 Urinary tract infection, site not specified: Secondary | ICD-10-CM | POA: Diagnosis present

## 2020-12-16 HISTORY — DX: Non-ST elevation (NSTEMI) myocardial infarction: I21.4

## 2020-12-16 LAB — POCT URINALYSIS DIP (DEVICE)
Bilirubin Urine: NEGATIVE
Glucose, UA: NEGATIVE mg/dL
Ketones, ur: NEGATIVE mg/dL
Nitrite: POSITIVE — AB
Protein, ur: NEGATIVE mg/dL
Specific Gravity, Urine: 1.015 (ref 1.005–1.030)
Urobilinogen, UA: 0.2 mg/dL (ref 0.0–1.0)
pH: 5 (ref 5.0–8.0)

## 2020-12-16 MED ORDER — NITROFURANTOIN MONOHYD MACRO 100 MG PO CAPS: 100.0000 mg | ORAL_CAPSULE | Freq: Two times a day (BID) | ORAL | 0 refills | Status: DC

## 2020-12-16 MED ORDER — PHENAZOPYRIDINE HCL 200 MG PO TABS: 200.0000 mg | ORAL_TABLET | Freq: Three times a day (TID) | ORAL | 0 refills | Status: DC

## 2020-12-16 NOTE — Discharge Instructions (Addendum)

## 2020-12-16 NOTE — ED Triage Notes (Signed)
Pt c/o urinary pressure, frequency and increased odor for about 3 days. Pt denies dysuria, hematuria or other symptoms.

## 2020-12-16 NOTE — ED Provider Notes (Signed)
[facility]    CSN: [id number] Arrival date & time: 12/16/20  1536      History   Chief Complaint Chief Complaint  Patient presents with   Urinary Frequency    HPI Tiffany Mcconnell is a 65 y.o. female.   HPI  66 year old female here for evaluation of urinary complaints.  Patient reports that she has had increased urinary pressure and urinary frequency for the last 3 days.  She also reports that her urine is darker and has a stronger odor.  She has had some low back pain.  She denies fever, blood in urine, pain with urination, abdominal pain, nausea or vomiting, urinary urgency, or vaginal discharge or complaints.  Past Medical History:  Diagnosis Date   Non-STEMI (non-ST elevated myocardial infarction) (HCC)     There are no problems to display for this patient.   Past Surgical History:  Procedure Laterality Date   APPENDECTOMY     CATARACT EXTRACTION W/PHACO Left 08/18/2018   Procedure: CATARACT EXTRACTION PHACO AND INTRAOCULAR LENS PLACEMENT (IOC)  LEFT;  Surgeon: Lockie Mola, MD;  Location: [facility];  Service: Ophthalmology;  Laterality: Left;   CATARACT EXTRACTION W/PHACO Right 11/17/2018   Procedure: CATARACT EXTRACTION PHACO AND INTRAOCULAR LENS PLACEMENT (IOC) RIGHT;  Surgeon: Lockie Mola, MD;  Location: [facility];  Service: Ophthalmology;  Laterality: Right;   COLONOSCOPY     EYE SURGERY     eyelid   FOOT SURGERY Left     OB History   No obstetric history on file.      Home Medications    Prior to Admission medications   Medication Sig Start Date End Date Taking? Authorizing Provider  aspirin 81 MG EC tablet Take by mouth. 09/11/20  Yes [provider]  atorvastatin (LIPITOR) 80 MG tablet Take by mouth. 09/11/20  Yes [provider]  metoprolol succinate (TOPROL-XL) 25 MG 24 hr tablet Take 0.5 tablets by mouth daily. 09/13/19  Yes [provider]  Multiple Vitamin (MULTIVITAMIN)  tablet Take 1 tablet by mouth daily.   Yes [provider]  Multiple Vitamins-Minerals (PRESERVISION AREDS 2 PO) Take by mouth.   Yes [provider]  nitrofurantoin, macrocrystal-monohydrate, (MACROBID) 100 MG capsule Take 1 capsule (100 mg total) by mouth 2 (two) times daily. 12/16/20  Yes Becky Augusta, NP  nitroGLYCERIN (NITROSTAT) 0.4 MG SL tablet 1 tablet under tongue for chest pain. May repeat q 5 min x 3. If not improved, call 911. 09/29/19  Yes [provider]  phenazopyridine (PYRIDIUM) 200 MG tablet Take 1 tablet (200 mg total) by mouth 3 (three) times daily. 12/16/20  Yes Becky Augusta, NP    Family History Family History  Problem Relation Age of Onset   Clotting disorder Mother    Heart failure Father     Social History Social History   Tobacco Use   Smoking status: Never   Smokeless tobacco: Never  Vaping Use   Vaping Use: Never used  Substance Use Topics   Alcohol use: No   Drug use: No     Allergies   Sulfa antibiotics, Codeine, Hydrocodone-acetaminophen, and Propoxyphene   Review of Systems Review of Systems  Constitutional:  Negative for activity change, appetite change and fever.  Gastrointestinal:  Positive for abdominal pain. Negative for nausea and vomiting.  Genitourinary:  Positive for frequency. Negative for dysuria, hematuria and urgency.  Musculoskeletal:  Positive for back pain.    Physical Exam Triage Vital Signs ED Triage Vitals  Enc Vitals Group     BP 12/16/20 1553 131/62     Pulse Rate 12/16/20 1553 75     Resp 12/16/20 1553 18     Temp 12/16/20 1553 98.7 F (37.1 C)     Temp Source 12/16/20 1553 Oral     SpO2 12/16/20 1553 97 %     Weight 12/16/20 1551 145 lb (65.8 kg)     Height 12/16/20 1551 5\' 7"  (1.702 m)     Head Circumference --      Peak Flow --      Pain Score 12/16/20 1551 0     Pain Loc --      Pain Edu? --      Excl. in GC? --    No data found.  Updated Vital Signs BP 131/62 (BP Location:  Left Arm)   Pulse 75   Temp 98.7 F (37.1 C) (Oral)   Resp 18   Ht 5\' 7"  (1.702 m)   Wt 145 lb (65.8 kg)   SpO2 97%   BMI 22.71 kg/m   Visual Acuity Right Eye Distance:   Left Eye Distance:   Bilateral Distance:    Right Eye Near:   Left Eye Near:    Bilateral Near:     Physical Exam Vitals and nursing note reviewed.  Constitutional:      General: She is not in acute distress.    Appearance: Normal appearance. She is normal weight. She is not ill-appearing.  HENT:     Head: Normocephalic and atraumatic.  Cardiovascular:     Rate and Rhythm: Normal rate and regular rhythm.     Pulses: Normal pulses.     Heart sounds: Normal heart sounds. No murmur heard.   No gallop.  Pulmonary:     Effort: Pulmonary effort is normal.     Breath sounds: Normal breath sounds. No wheezing, rhonchi or rales.  Abdominal:     General: Abdomen is flat.     Palpations: Abdomen is soft.     Tenderness: There is abdominal tenderness. There is no right CVA tenderness, left CVA tenderness, guarding or rebound.  Skin:    General: Skin is warm and dry.     Capillary Refill: Capillary refill takes less than 2 seconds.     Findings: No erythema or rash.  Neurological:     General: No focal deficit present.     Mental Status: She is alert and oriented to person, place, and time.  Psychiatric:        Mood and Affect: Mood normal.        Behavior: Behavior normal.        Thought Content: Thought content normal.        Judgment: Judgment normal.     UC Treatments / Results  Labs (all labs ordered are listed, but only abnormal results are displayed) Labs Reviewed  POCT URINALYSIS DIP (DEVICE) - Abnormal; Notable for the following components:      Result Value   Hgb urine dipstick MODERATE (*)    Nitrite POSITIVE (*)    Leukocytes,Ua SMALL (*)    All other components within normal limits  URINE CULTURE  POCT URINALYSIS DIPSTICK, ED / UC    EKG   Radiology No results  found.  Procedures Procedures (including critical care time)  Medications Ordered in UC Medications - No data to display  Initial Impression / Assessment and Plan / UC Course  I have reviewed the triage vital signs and  the nursing notes.  Pertinent labs & imaging results that were available during my care of the patient were reviewed by me and considered in my medical decision making (see chart for details).  Patient is a very pleasant 65 year old female here for evaluation of urinary complaints as outlined in HPI above.  Patient's physical exam reveals a benign cardiopulmonary exam with clear lung sounds in all fields.  Patient has no CVA tenderness on exam.  Abdomen is flat, soft, with mild suprapubic tenderness but no other tenderness or guarding or rebound in the abdomen.  UA collected at triage.  Urine dip shows nitrate positive, leukocyte positive, with moderate blood.  We will send urine for culture.  Will discharge patient home on Macrobid twice daily for 5 days with food and Pyridium to with urinary discomfort.  Patient advised to increase her oral fluid intake so that she increases her urine production and flushes her system.  Patient vies if the culture grows out a bacteria that will not be treated by the Macrobid we will call her and she is antibiotic at that time.   Final Clinical Impressions(s) / UC Diagnoses   Final diagnoses:  Lower urinary tract infectious disease     Discharge Instructions      Take the Macrobid twice daily for 5 days with food for treatment of urinary tract infection.  Use the Pyridium every 8 hours as needed for urinary discomfort.  This will turn your urine a bright red-orange.  Increase your oral fluid intake so that you increase your urine production and or flushing your urinary system.  Take an over-the-counter probiotic, such as Culturelle-Align-Activia, 1 hour after each dose of antibiotic to prevent diarrhea or yeast infections from  forming.  We will culture urine and change the antibiotics if necessary.  Return for reevaluation, or see your primary care provider, for any new or worsening symptoms.      ED Prescriptions     Medication Sig Dispense Auth. Provider   nitrofurantoin, macrocrystal-monohydrate, (MACROBID) 100 MG capsule Take 1 capsule (100 mg total) by mouth 2 (two) times daily. 10 capsule Becky Augusta, NP   phenazopyridine (PYRIDIUM) 200 MG tablet Take 1 tablet (200 mg total) by mouth 3 (three) times daily. 6 tablet Becky Augusta, NP      PDMP not reviewed this encounter.   Becky Augusta, NP 12/16/20 [phone]

## 2020-12-19 LAB — URINE CULTURE: Culture: >=100000 — AB

## 2021-01-10 ENCOUNTER — Ambulatory Visit
Admission: EM | Admit: 2021-01-10 | Discharge: 2021-01-10 | Disposition: A | Discharge status: Home / Self Care | Payer: Medicare Other | Attending: Physician Assistant | Admitting: Physician Assistant

## 2021-01-10 ENCOUNTER — Other Ambulatory Visit: Payer: Self-pay

## 2021-01-10 DIAGNOSIS — M546 Pain in thoracic spine: Secondary | ICD-10-CM | POA: Insufficient documentation

## 2021-01-10 DIAGNOSIS — Z885 Allergy status to narcotic agent status: Secondary | ICD-10-CM | POA: Insufficient documentation

## 2021-01-10 DIAGNOSIS — I252 Old myocardial infarction: Secondary | ICD-10-CM | POA: Diagnosis not present

## 2021-01-10 DIAGNOSIS — I2510 Atherosclerotic heart disease of native coronary artery without angina pectoris: Secondary | ICD-10-CM | POA: Diagnosis not present

## 2021-01-10 DIAGNOSIS — B349 Viral infection, unspecified: Secondary | ICD-10-CM

## 2021-01-10 DIAGNOSIS — Z882 Allergy status to sulfonamides status: Secondary | ICD-10-CM | POA: Diagnosis not present

## 2021-01-10 DIAGNOSIS — R059 Cough, unspecified: Secondary | ICD-10-CM

## 2021-01-10 DIAGNOSIS — Z7982 Long term (current) use of aspirin: Secondary | ICD-10-CM | POA: Insufficient documentation

## 2021-01-10 DIAGNOSIS — Z7901 Long term (current) use of anticoagulants: Secondary | ICD-10-CM | POA: Insufficient documentation

## 2021-01-10 DIAGNOSIS — Z87440 Personal history of urinary (tract) infections: Secondary | ICD-10-CM | POA: Insufficient documentation

## 2021-01-10 DIAGNOSIS — R350 Frequency of micturition: Secondary | ICD-10-CM

## 2021-01-10 DIAGNOSIS — E785 Hyperlipidemia, unspecified: Secondary | ICD-10-CM | POA: Diagnosis not present

## 2021-01-10 DIAGNOSIS — Z79899 Other long term (current) drug therapy: Secondary | ICD-10-CM | POA: Insufficient documentation

## 2021-01-10 DIAGNOSIS — R5383 Other fatigue: Secondary | ICD-10-CM | POA: Diagnosis not present

## 2021-01-10 DIAGNOSIS — U071 COVID-19: Secondary | ICD-10-CM | POA: Insufficient documentation

## 2021-01-10 LAB — URINALYSIS, COMPLETE (UACMP) WITH MICROSCOPIC
Bilirubin Urine: NEGATIVE
Glucose, UA: NEGATIVE mg/dL
Ketones, ur: NEGATIVE mg/dL
Leukocytes,Ua: NEGATIVE
Nitrite: NEGATIVE
Protein, ur: NEGATIVE mg/dL
Specific Gravity, Urine: 1.015 (ref 1.005–1.030)
pH: 8.5 — ABNORMAL HIGH (ref 5.0–8.0)

## 2021-01-10 NOTE — ED Provider Notes (Signed)
[facility]    CSN: [id number] Arrival date & time: 01/10/21  1355      History   Chief Complaint Chief Complaint  Patient presents with   urinary symptoms    HPI Tiffany Mcconnell is a 65 y.o. female presenting for approximate 4-day history of fatigue with approximately 2-day history of congestion and postnasal drainage and a very mild cough.  She denies any fevers or sore throat.  No chest pain or breathing difficulty.  No vomiting or diarrhea.  Patient reports recently being out of town in [facility] and states that she started to feel bad when she got home a few days ago.  Patient denies any known recent COVID exposure.  She has been vaccinated for COVID-19 x2.    Patient also has concerns for possible UTI.  Admits any urinary frequency and feels like her urine is dark.  Also admits to use bilateral mid back pain.  She denies any urgency, hematuria, dysuria does not report any abnormal vaginal itching, discharge or odor.  Patient had a UTI diagnosed last month and she had a previous UTI in April 2022.  Patient states that she was feeling pretty poorly with both of the UTIs and she wants to make sure she does not have another one.  Patient's past medical history significant for history of NSTEMI last year, CAD and hyperlipidemia.  HPI  Past Medical History:  Diagnosis Date   Non-STEMI (non-ST elevated myocardial infarction) (HCC)     There are no problems to display for this patient.   Past Surgical History:  Procedure Laterality Date   APPENDECTOMY     CATARACT EXTRACTION W/PHACO Left 08/18/2018   Procedure: CATARACT EXTRACTION PHACO AND INTRAOCULAR LENS PLACEMENT (IOC)  LEFT;  Surgeon: Lockie Mola, MD;  Location: [facility];  Service: Ophthalmology;  Laterality: Left;   CATARACT EXTRACTION W/PHACO Right 11/17/2018   Procedure: CATARACT EXTRACTION PHACO AND INTRAOCULAR LENS PLACEMENT (IOC) RIGHT;  Surgeon: Lockie Mola, MD;  Location:  [facility];  Service: Ophthalmology;  Laterality: Right;   COLONOSCOPY     EYE SURGERY     eyelid   FOOT SURGERY Left     OB History   No obstetric history on file.      Home Medications    Prior to Admission medications   Medication Sig Start Date End Date Taking? Authorizing Provider  aspirin 81 MG EC tablet Take by mouth. 09/11/20   [provider]  atorvastatin (LIPITOR) 80 MG tablet Take by mouth. 09/11/20   [provider]  metoprolol succinate (TOPROL-XL) 25 MG 24 hr tablet Take 0.5 tablets by mouth daily. 09/13/19   [provider]  Multiple Vitamin (MULTIVITAMIN) tablet Take 1 tablet by mouth daily.    [provider]  Multiple Vitamins-Minerals (PRESERVISION AREDS 2 PO) Take by mouth.    [provider]  nitroGLYCERIN (NITROSTAT) 0.4 MG SL tablet 1 tablet under tongue for chest pain. May repeat q 5 min x 3. If not improved, call 911. 09/29/19   [provider]    Family History Family History  Problem Relation Age of Onset   Clotting disorder Mother    Heart failure Father     Social History Social History   Tobacco Use   Smoking status: Never   Smokeless tobacco: Never  Vaping Use   Vaping Use: Never used  Substance Use Topics   Alcohol use: No   Drug use: No     Allergies  Sulfa antibiotics, Codeine, Hydrocodone-acetaminophen, and Propoxyphene   Review of Systems Review of Systems  Constitutional:  Positive for fatigue. Negative for chills, diaphoresis and fever.  HENT:  Positive for congestion, postnasal drip and rhinorrhea. Negative for ear pain, sinus pressure, sinus pain and sore throat.   Respiratory:  Positive for cough. Negative for shortness of breath.   Gastrointestinal:  Negative for abdominal pain, diarrhea, nausea and vomiting.  Genitourinary:  Positive for frequency. Negative for dysuria, flank pain, hematuria, pelvic pain, urgency and vaginal discharge.  Musculoskeletal:   Positive for back pain. Negative for arthralgias and myalgias.  Skin:  Negative for rash.  Neurological:  Negative for weakness and headaches.  Hematological:  Negative for adenopathy.    Physical Exam Triage Vital Signs ED Triage Vitals [01/10/21 1411]  Enc Vitals Group     BP 131/66     Pulse Rate 92     Resp 17     Temp 99.7 F (37.6 C)     Temp Source Oral     SpO2 98 %     Weight 145 lb (65.8 kg)     Height 5\' 7"  (1.702 m)     Head Circumference      Peak Flow      Pain Score 0     Pain Loc      Pain Edu?      Excl. in GC?    No data found.  Updated Vital Signs BP 131/66 (BP Location: Left Arm)   Pulse 92   Temp 99.7 F (37.6 C) (Oral)   Resp 17   Ht 5\' 7"  (1.702 m)   Wt 145 lb (65.8 kg)   SpO2 98%   BMI 22.71 kg/m       Physical Exam Vitals and nursing note reviewed.  Constitutional:      General: She is not in acute distress.    Appearance: Normal appearance. She is not ill-appearing or toxic-appearing.  HENT:     Head: Normocephalic and atraumatic.     Nose: Congestion present.     Mouth/Throat:     Mouth: Mucous membranes are moist.     Pharynx: Oropharynx is clear.  Eyes:     General: No scleral icterus.       Right eye: No discharge.        Left eye: No discharge.     Conjunctiva/sclera: Conjunctivae normal.  Cardiovascular:     Rate and Rhythm: Normal rate and regular rhythm.     Heart sounds: Normal heart sounds.  Pulmonary:     Effort: Pulmonary effort is normal. No respiratory distress.     Breath sounds: Normal breath sounds.  Abdominal:     Palpations: Abdomen is soft.     Tenderness: There is no abdominal tenderness. There is no right CVA tenderness or left CVA tenderness.  Musculoskeletal:     Cervical back: Neck supple.  Skin:    General: Skin is dry.  Neurological:     General: No focal deficit present.     Mental Status: She is alert. Mental status is at baseline.     Motor: No weakness.     Gait: Gait normal.   Psychiatric:        Mood and Affect: Mood normal.        Behavior: Behavior normal.        Thought Content: Thought content normal.     UC Treatments / Results  Labs (all labs ordered are listed, but only  abnormal results are displayed) Labs Reviewed  URINALYSIS, COMPLETE (UACMP) WITH MICROSCOPIC - Abnormal; Notable for the following components:      Result Value   pH 8.5 (*)    Hgb urine dipstick SMALL (*)    Bacteria, UA FEW (*)    All other components within normal limits  SARS CORONAVIRUS 2 (TAT 6-24 HRS)  URINE CULTURE    EKG   Radiology No results found.  Procedures Procedures (including critical care time)  Medications Ordered in UC Medications - No data to display  Initial Impression / Assessment and Plan / UC Course  I have reviewed the triage vital signs and the nursing notes.  Pertinent labs & imaging results that were available during my care of the patient were reviewed by me and considered in my medical decision making (see chart for details).   65 year old female presenting for concerns about cough and congestion over the past couple days.  Patient did recently travel.  No known recent COVID exposure.  Vaccinated for COVID-19 x2.  Additionally is concerned about possible UTI.  Admits to urinary frequency but no painful urination or hematuria.  Exam is significant for mild congestion.  Chest is clear to auscultation heart regular rate and rhythm.  No significant abdominal tenderness or CVA tenderness.  UA today shows small blood.  No other abnormal findings or findings indicative of UTI.  We will send urine for culture and treat for UTI if culture is positive.  COVID test obtained.  Current CDC guidelines, isolation protocol and ED precautions reviewed with patient.  Advised she likely has a viral illness and supportive care encouraged with increasing rest and fluids and over-the-counter Mucinex and nasal sprays.  Advised that if she is positive for  COVID-19 (which I cannot rule out until I get the test results), we can send in antiviral medication for her as she would be a candidate given her age and history of NSTEMI last year.  Patient is agreeable to this plan.   Final Clinical Impressions(s) / UC Diagnoses   Final diagnoses:  Viral illness  Cough  Fatigue, unspecified type  Urinary frequency     Discharge Instructions      URINARY FREQUENCY: The urinalysis today does not appear to be consistent with a UTI but in light of your 2 recent UTIs in the past 4 months, I will send the urine for culture to make sure you do not have a UTI and someone contact you if any disorder antibiotics.  For now just increase your fluid intake and rest.  URI/COLD SYMPTOMS: Your exam today is consistent with a viral illness. Antibiotics are not indicated at this time. Use medications as directed, including cough syrup, nasal saline, and decongestants. Your symptoms should improve over the next few days and resolve within 7-10 days. Increase rest and fluids. F/u if symptoms worsen or predominate such as sore throat, ear pain, productive cough, shortness of breath, or if you develop high fevers or worsening fatigue over the next several days.    You have received COVID testing today either for positive exposure, concerning symptoms that could be related to COVID infection, screening purposes, or re-testing after confirmed positive.  Your test obtained today checks for active viral infection in the last 1-2 weeks. If your test is negative now, you can still test positive later. So, if you do develop symptoms you should either get re-tested and/or isolate x 5 days and then strict mask use x 5 days (unvaccinated) or mask  use x 10 days (vaccinated). Please follow CDC guidelines.  While Rapid antigen tests come back in 15-20 minutes, send out PCR/molecular test results typically come back within 1-3 days. In the mean time, if you are symptomatic, assume this could  be a positive test and treat/monitor yourself as if you do have COVID.   We will call with test results if positive. Please download the MyChart app and set up a profile to access test results.   If symptomatic, go home and rest. Push fluids. Take Tylenol as needed for discomfort. Gargle warm salt water. Throat lozenges. Take Mucinex DM or Robitussin for cough. Humidifier in bedroom to ease coughing. Warm showers. Also review the COVID handout for more information.  COVID-19 INFECTION: The incubation period of COVID-19 is approximately 14 days after exposure, with most symptoms developing in roughly 4-5 days. Symptoms may range in severity from mild to critically severe. Roughly 80% of those infected will have mild symptoms. People of any age may become infected with COVID-19 and have the ability to transmit the virus. The most common symptoms include: fever, fatigue, cough, body aches, headaches, sore throat, nasal congestion, shortness of breath, nausea, vomiting, diarrhea, changes in smell and/or taste.    COURSE OF ILLNESS Some patients may begin with mild disease which can progress quickly into critical symptoms. If your symptoms are worsening please call ahead to the Emergency Department and proceed there for further treatment. Recovery time appears to be roughly 1-2 weeks for mild symptoms and 3-6 weeks for severe disease.   GO IMMEDIATELY TO ER FOR FEVER YOU ARE UNABLE TO GET DOWN WITH TYLENOL, BREATHING PROBLEMS, CHEST PAIN, FATIGUE, LETHARGY, INABILITY TO EAT OR DRINK, ETC  QUARANTINE AND ISOLATION: To help decrease the spread of COVID-19 please remain isolated if you have COVID infection or are highly suspected to have COVID infection. This means -stay home and isolate to one room in the home if you live with others. Do not share a bed or bathroom with others while ill, sanitize and wipe down all countertops and keep common areas clean and disinfected. Stay home for 5 days. If you have no  symptoms or your symptoms are resolving after 5 days, you can leave your house. Continue to wear a mask around others for 5 additional days. If you have been in close contact (within 6 feet) of someone diagnosed with COVID 19, you are advised to quarantine in your home for 14 days as symptoms can develop anywhere from 2-14 days after exposure to the virus. If you develop symptoms, you  must isolate.  Most current guidelines for COVID after exposure -unvaccinated: isolate 5 days and strict mask use x 5 days. Test on day 5 is possible -vaccinated: wear mask x 10 days if symptoms do not develop -You do not necessarily need to be tested for COVID if you have + exposure and  develop symptoms. Just isolate at home x10 days from symptom onset During this global pandemic, CDC advises to practice social distancing, try to stay at least 65ft away from others at all times. Wear a face covering. Wash and sanitize your hands regularly and avoid going anywhere that is not necessary.  KEEP IN MIND THAT THE COVID TEST IS NOT 100% ACCURATE AND YOU SHOULD STILL DO EVERYTHING TO PREVENT POTENTIAL SPREAD OF VIRUS TO OTHERS (WEAR MASK, WEAR GLOVES, WASH HANDS AND SANITIZE REGULARLY). IF INITIAL TEST IS NEGATIVE, THIS MAY NOT MEAN YOU ARE DEFINITELY NEGATIVE. MOST ACCURATE TESTING IS DONE 5-7  DAYS AFTER EXPOSURE.   It is not advised by CDC to get re-tested after receiving a positive COVID test since you can still test positive for weeks to months after you have already cleared the virus.   *If you have not been vaccinated for COVID, I strongly suggest you consider getting vaccinated as long as there are no contraindications.       ED Prescriptions   None    PDMP not reviewed this encounter.   Shirlee Latch, PA-C 01/10/21 1507

## 2021-01-10 NOTE — ED Triage Notes (Signed)
Pt states she thinks she has a UTI. Feeling tired, back pain, strong smelling urine, frequency. Also states she is mildly congested but no cough or fever.

## 2021-01-10 NOTE — Discharge Instructions (Addendum)
URINARY FREQUENCY: The urinalysis today does not appear to be consistent with a UTI but in light of your 2 recent UTIs in the past 4 months, I will send the urine for culture to make sure you do not have a UTI and someone contact you if any disorder antibiotics.  For now just increase your fluid intake and rest.  URI/COLD SYMPTOMS: Your exam today is consistent with a viral illness. Antibiotics are not indicated at this time. Use medications as directed, including cough syrup, nasal saline, and decongestants. Your symptoms should improve over the next few days and resolve within 7-10 days. Increase rest and fluids. F/u if symptoms worsen or predominate such as sore throat, ear pain, productive cough, shortness of breath, or if you develop high fevers or worsening fatigue over the next several days.    You have received COVID testing today either for positive exposure, concerning symptoms that could be related to COVID infection, screening purposes, or re-testing after confirmed positive.  Your test obtained today checks for active viral infection in the last 1-2 weeks. If your test is negative now, you can still test positive later. So, if you do develop symptoms you should either get re-tested and/or isolate x 5 days and then strict mask use x 5 days (unvaccinated) or mask use x 10 days (vaccinated). Please follow CDC guidelines.  While Rapid antigen tests come back in 15-20 minutes, send out PCR/molecular test results typically come back within 1-3 days. In the mean time, if you are symptomatic, assume this could be a positive test and treat/monitor yourself as if you do have COVID.   We will call with test results if positive. Please download the MyChart app and set up a profile to access test results.   If symptomatic, go home and rest. Push fluids. Take Tylenol as needed for discomfort. Gargle warm salt water. Throat lozenges. Take Mucinex DM or Robitussin for cough. Humidifier in bedroom to ease  coughing. Warm showers. Also review the COVID handout for more information.  COVID-19 INFECTION: The incubation period of COVID-19 is approximately 14 days after exposure, with most symptoms developing in roughly 4-5 days. Symptoms may range in severity from mild to critically severe. Roughly 80% of those infected will have mild symptoms. People of any age may become infected with COVID-19 and have the ability to transmit the virus. The most common symptoms include: fever, fatigue, cough, body aches, headaches, sore throat, nasal congestion, shortness of breath, nausea, vomiting, diarrhea, changes in smell and/or taste.    COURSE OF ILLNESS Some patients may begin with mild disease which can progress quickly into critical symptoms. If your symptoms are worsening please call ahead to the Emergency Department and proceed there for further treatment. Recovery time appears to be roughly 1-2 weeks for mild symptoms and 3-6 weeks for severe disease.   GO IMMEDIATELY TO ER FOR FEVER YOU ARE UNABLE TO GET DOWN WITH TYLENOL, BREATHING PROBLEMS, CHEST PAIN, FATIGUE, LETHARGY, INABILITY TO EAT OR DRINK, ETC  QUARANTINE AND ISOLATION: To help decrease the spread of COVID-19 please remain isolated if you have COVID infection or are highly suspected to have COVID infection. This means -stay home and isolate to one room in the home if you live with others. Do not share a bed or bathroom with others while ill, sanitize and wipe down all countertops and keep common areas clean and disinfected. Stay home for 5 days. If you have no symptoms or your symptoms are resolving after 5 days,  you can leave your house. Continue to wear a mask around others for 5 additional days. If you have been in close contact (within 6 feet) of someone diagnosed with COVID 19, you are advised to quarantine in your home for 14 days as symptoms can develop anywhere from 2-14 days after exposure to the virus. If you develop symptoms, you  must  isolate.  Most current guidelines for COVID after exposure -unvaccinated: isolate 5 days and strict mask use x 5 days. Test on day 5 is possible -vaccinated: wear mask x 10 days if symptoms do not develop -You do not necessarily need to be tested for COVID if you have + exposure and  develop symptoms. Just isolate at home x10 days from symptom onset During this global pandemic, CDC advises to practice social distancing, try to stay at least 61ft away from others at all times. Wear a face covering. Wash and sanitize your hands regularly and avoid going anywhere that is not necessary.  KEEP IN MIND THAT THE COVID TEST IS NOT 100% ACCURATE AND YOU SHOULD STILL DO EVERYTHING TO PREVENT POTENTIAL SPREAD OF VIRUS TO OTHERS (WEAR MASK, WEAR GLOVES, WASH HANDS AND SANITIZE REGULARLY). IF INITIAL TEST IS NEGATIVE, THIS MAY NOT MEAN YOU ARE DEFINITELY NEGATIVE. MOST ACCURATE TESTING IS DONE 5-7 DAYS AFTER EXPOSURE.   It is not advised by CDC to get re-tested after receiving a positive COVID test since you can still test positive for weeks to months after you have already cleared the virus.   *If you have not been vaccinated for COVID, I strongly suggest you consider getting vaccinated as long as there are no contraindications.

## 2021-01-11 ENCOUNTER — Telehealth (HOSPITAL_COMMUNITY): Payer: Self-pay | Admitting: Emergency Medicine

## 2021-01-11 LAB — SARS CORONAVIRUS 2 (TAT 6-24 HRS): SARS Coronavirus 2: POSITIVE — AB

## 2021-01-11 MED ORDER — MOLNUPIRAVIR EUA 200MG CAPSULE: 4.0000 | ORAL_CAPSULE | Freq: Two times a day (BID) | ORAL | 0 refills | Status: AC

## 2021-01-12 LAB — URINE CULTURE

## 2021-08-07 ENCOUNTER — Ambulatory Visit (INDEPENDENT_AMBULATORY_CARE_PROVIDER_SITE_OTHER): Payer: Medicare Other

## 2021-08-07 ENCOUNTER — Ambulatory Visit
Admission: EM | Admit: 2021-08-07 | Discharge: 2021-08-07 | Disposition: A | Discharge status: Home / Self Care | Payer: Medicare Other | Attending: Physician Assistant | Admitting: Physician Assistant

## 2021-08-07 ENCOUNTER — Encounter: Payer: Self-pay | Admitting: Emergency Medicine

## 2021-08-07 ENCOUNTER — Other Ambulatory Visit: Payer: Self-pay

## 2021-08-07 DIAGNOSIS — M7731 Calcaneal spur, right foot: Secondary | ICD-10-CM | POA: Diagnosis not present

## 2021-08-07 DIAGNOSIS — M79671 Pain in right foot: Secondary | ICD-10-CM

## 2021-08-07 MED ORDER — PREDNISONE 10 MG PO TABS: ORAL_TABLET | ORAL | 0 refills | Status: DC

## 2021-08-07 NOTE — Discharge Instructions (Addendum)
-  You have heel spurs.  See handout for more information.  They can become irritated and inflamed from time to time.  I sent prednisone to the pharmacy.  You can also ice the area to help with swelling and elevated.  Consider over-the-counter Voltaren gel as well. ?- If heel spurs are not getting better over the next 1 week or if they worsen you may need to see orthopedics.  Can go to [facility].  Sometimes, people have to get the injections into the heel to help with inflammation and pain. ?

## 2021-08-07 NOTE — ED Provider Notes (Signed)
[facility]    CSN: [id number] Arrival date & time: 08/07/21  1823      History   Chief Complaint Chief Complaint  Patient presents with   Foot Pain    Right heel    HPI Tiffany Mcconnell is a 66 y.o. female presenting for right heel pain since yesterday.  Patient reports that her is redness and swelling in this area.  She denies any injury.  She is wearing heels.  States that heels have not rubbed this area and are actually comfortable.  Patient wears heels consistently and does a lot of walking.  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  She has never had anything like this before.  She has taken ibuprofen which "took the edge off."  Admits to increased pain when she tries to walk.  No numbness or tingling.  No fever.  Denies any known insect/bug bites.  Denies pruritus.  No history of gout or significant arthritis.  No history of surgery on this foot.  No other concerns.  HPI  Past Medical History:  Diagnosis Date   Non-STEMI (non-ST elevated myocardial infarction) ([place])     There are no problems to display for this patient.   Past Surgical History:  Procedure Laterality Date   APPENDECTOMY     CATARACT EXTRACTION W/PHACO Left 08/18/2018   Procedure: CATARACT EXTRACTION PHACO AND INTRAOCULAR LENS PLACEMENT ([place])  LEFT;  Surgeon: Leandrew Koyanagi, MD;  Location: [place];  Service: Ophthalmology;  Laterality: Left;   CATARACT EXTRACTION W/PHACO Right 11/17/2018   Procedure: CATARACT EXTRACTION PHACO AND INTRAOCULAR LENS PLACEMENT ([place]) RIGHT;  Surgeon: Leandrew Koyanagi, MD;  Location: [facility];  Service: Ophthalmology;  Laterality: Right;   COLONOSCOPY     EYE SURGERY     eyelid   FOOT SURGERY Left     OB History   No obstetric history on file.      Home Medications    Prior to Admission medications   Medication Sig Start Date End Date Taking? Authorizing Provider  aspirin 81 MG EC tablet Take by  mouth. 09/11/20  Yes [provider]  atorvastatin (LIPITOR) 80 MG tablet Take by mouth. 09/11/20  Yes [provider]  metoprolol succinate (TOPROL-XL) 25 MG 24 hr tablet Take 0.5 tablets by mouth daily. 09/13/19  Yes [provider]  Multiple Vitamin (MULTIVITAMIN) tablet Take 1 tablet by mouth daily.   Yes [provider]  Multiple Vitamins-Minerals (PRESERVISION AREDS 2 PO) Take by mouth.   Yes [provider]  predniSONE (DELTASONE) 10 MG tablet Take 6 tabs p.o. on day 1 and decrease by 1 tablet daily until complete 08/07/21  Yes Laurene Footman B, PA-C  nitroGLYCERIN (NITROSTAT) 0.4 MG SL tablet 1 tablet under tongue for chest pain. May repeat q 5 min x 3. If not improved, call 911. 09/29/19   [provider]    Family History Family History  Problem Relation Age of Onset   Clotting disorder Mother    Heart failure Father     Social History Social History   Tobacco Use   Smoking status: Never   Smokeless tobacco: Never  Vaping Use   Vaping Use: Never used  Substance Use Topics   Alcohol use: No   Drug use: No     Allergies   Sulfa antibiotics, Codeine, Hydrocodone-acetaminophen, and Propoxyphene   Review of Systems Review of Systems  Constitutional:  Negative for fatigue and fever.  Musculoskeletal:  Positive for arthralgias, gait problem and joint swelling.  Skin:  Positive for color change. Negative for wound.  Neurological:  Negative for weakness and numbness.    Physical Exam Triage Vital Signs ED Triage Vitals  Enc Vitals Group     BP 08/07/21 1837 128/70     Pulse Rate 08/07/21 1837 73     Resp 08/07/21 1837 18     Temp 08/07/21 1837 98.4 F (36.9 C)     Temp Source 08/07/21 1837 Oral     SpO2 08/07/21 1837 97 %     Weight 08/07/21 1836 145 lb 1 oz (65.8 kg)     Height 08/07/21 1836 5\' 7"  (1.702 m)     Head Circumference --      Peak Flow --      Pain Score 08/07/21 1834 8     Pain Loc --      Pain  Edu? --      Excl. in [place]? --    No data found.  Updated Vital Signs BP 128/70 (BP Location: Right Arm)    Pulse 73    Temp 98.4 F (36.9 C) (Oral)    Resp 18    Ht 5\' 7"  (1.702 m)    Wt 145 lb 1 oz (65.8 kg)    SpO2 97%    BMI 22.72 kg/m       Physical Exam Vitals and nursing note reviewed.  Constitutional:      General: She is not in acute distress.    Appearance: Normal appearance. She is not ill-appearing or toxic-appearing.  HENT:     Head: Normocephalic and atraumatic.  Eyes:     General: No scleral icterus.       Right eye: No discharge.        Left eye: No discharge.     Conjunctiva/sclera: Conjunctivae normal.  Cardiovascular:     Rate and Rhythm: Normal rate and regular rhythm.     Heart sounds: Normal heart sounds.  Pulmonary:     Effort: Pulmonary effort is normal. No respiratory distress.     Breath sounds: Normal breath sounds.  Musculoskeletal:     Cervical back: Neck supple.     Right foot: Normal range of motion. Swelling (mild swelling with warmth and erythema where achilles tendon meets calcaneus) and bony tenderness (TTP at calcaneus (insertion of achilles tendon)) present. No deformity. Normal pulse.  Skin:    General: Skin is dry.  Neurological:     General: No focal deficit present.     Mental Status: She is alert. Mental status is at baseline.     Motor: No weakness.     Gait: Gait abnormal.  Psychiatric:        Mood and Affect: Mood normal.        Behavior: Behavior normal.        Thought Content: Thought content normal.     UC Treatments / Results  Labs (all labs ordered are listed, but only abnormal results are displayed) Labs Reviewed - No data to display  EKG   Radiology DG Foot Complete Right  Result Date: 08/07/2021 CLINICAL DATA:  Heel pain and swelling.  Some redness in the area. EXAM: RIGHT FOOT COMPLETE - 3+ VIEW COMPARISON:  None. FINDINGS: Mild great toe metatarsophalangeal joint space narrowing with moderate dorsal and mild  medial and lateral metatarsal head degenerative osteophytosis. Mild chronic spur at the Achilles insertion on the calcaneus. Old healed fracture of the distal shaft of  fifth metatarsal. No cortical erosion.  No acute fracture or dislocation. IMPRESSION:: IMPRESSION: 1. Mild chronic spurring at the Achilles insertion on the calcaneus. 2. 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 Electronically Signed   By: Yvonne Kendall M.D.   On: 08/07/2021 19:19    Procedures Procedures (including critical care time)  Medications Ordered in UC Medications - No data to display  Initial Impression / Assessment and Plan / UC Course  I have reviewed the triage vital signs and the nursing notes.  Pertinent labs & imaging results that were available during my care of the patient were reviewed by me and considered in my medical decision making (see chart for details).  67 year old female presenting for atraumatic right heel pain.  It is associated with erythema and swelling as well as warmth of this area.  Area of concern is where the Achilles meets the calcaneus.  X-ray of foot obtained today shows mild chronic spurring at the Achilles insertion on the calcaneus and mild to moderate great toe MTP joint OA.  Discussed this with patient.  She is having pain and the symptoms at the exact area of the spurring.  Advised her this area is inflamed.  Patient has history of heart disease.  We will try to avoid NSAIDs.  Starting her on prednisone pack.  Also reviewed icing the area and elevating it and trying to stay off her feet as well as do stretches.  Reviewed following up with orthopedics if not improving over the next week or if symptoms worsen.  Final Clinical Impressions(s) / UC Diagnoses   Final diagnoses:  Pain of right heel  Calcaneal spur of right foot     Discharge Instructions      -You have heel spurs.  See handout for more information.  They can become irritated and inflamed  from time to time.  I sent prednisone to the pharmacy.  You can also ice the area to help with swelling and elevated.  Consider over-the-counter Voltaren gel as well. - If heel spurs are not getting better over the next 1 week or if they worsen you may need to see orthopedics.  Can go to [facility].  Sometimes, people have to get the injections into the heel to help with inflammation and pain.     ED Prescriptions     Medication Sig Dispense Auth. Provider   predniSONE (DELTASONE) 10 MG tablet Take 6 tabs p.o. on day 1 and decrease by 1 tablet daily until complete 21 tablet Danton Clap, PA-C      PDMP not reviewed this encounter.   Danton Clap, PA-C 08/08/21 [phone]

## 2021-08-07 NOTE — ED Triage Notes (Signed)
Pt c/o right heel pain. Started yesterday. She has some redness in the area. She states standing on her foot makes it worse. Denies injury.  ?

## 2023-02-13 ENCOUNTER — Ambulatory Visit
Admission: EM | Admit: 2023-02-13 | Discharge: 2023-02-13 | Disposition: A | Discharge status: Home / Self Care | Payer: Medicare Other

## 2023-02-13 ENCOUNTER — Encounter: Payer: Self-pay | Admitting: Emergency Medicine

## 2023-02-13 DIAGNOSIS — J069 Acute upper respiratory infection, unspecified: Secondary | ICD-10-CM | POA: Diagnosis not present

## 2023-02-13 MED ORDER — DOXYCYCLINE HYCLATE 100 MG PO CAPS: 100.0000 mg | ORAL_CAPSULE | Freq: Two times a day (BID) | ORAL | 0 refills | Status: DC

## 2023-02-13 NOTE — Discharge Instructions (Addendum)
Rest,push fluids,take antibiotic as directed(doxycycline). Follow up with PCP. May take OTC meds for symptom management (flonase, afrin, tylenol,ibuprofen,chloraseptic, etc). Return as needed

## 2023-02-13 NOTE — ED Provider Notes (Signed)
[facility]    CSN: [id number] Arrival date & time: 02/13/23  0805      History   Chief Complaint Chief Complaint  Patient presents with   Sinus Problem   Nasal Congestion    HPI Tiffany Mcconnell is a 67 y.o. female.   67 year old female, Tiffany Mcconnell, presents to urgent care for sinus congestion and pain x 10 days, denies fevers.   The history is provided by the patient. No language interpreter was used.    Past Medical History:  Diagnosis Date   Non-STEMI (non-ST elevated myocardial infarction) [facility])     Patient Active Problem List   Diagnosis Date Noted   URI, acute 02/13/2023    Past Surgical History:  Procedure Laterality Date   APPENDECTOMY     CATARACT EXTRACTION W/PHACO Left 08/18/2018   Procedure: CATARACT EXTRACTION PHACO AND INTRAOCULAR LENS PLACEMENT ([facility])  LEFT;  Surgeon: Lockie Mola, MD;  Location: [facility];  Service: Ophthalmology;  Laterality: Left;   CATARACT EXTRACTION W/PHACO Right 11/17/2018   Procedure: CATARACT EXTRACTION PHACO AND INTRAOCULAR LENS PLACEMENT ([facility]) RIGHT;  Surgeon: Lockie Mola, MD;  Location: [facility];  Service: Ophthalmology;  Laterality: Right;   COLONOSCOPY     EYE SURGERY     eyelid   FOOT SURGERY Left     OB History   No obstetric history on file.      Home Medications    Prior to Admission medications   Medication Sig Start Date End Date Taking? Authorizing Provider  aspirin 81 MG EC tablet Take by mouth. 09/11/20   [provider]  atorvastatin (LIPITOR) 80 MG tablet Take by mouth. 09/11/20   [provider]  metoprolol succinate (TOPROL-XL) 25 MG 24 hr tablet Take 0.5 tablets by mouth daily. 09/13/19   [provider]  Multiple Vitamin (MULTIVITAMIN) tablet Take 1 tablet by mouth daily.    [provider]  Multiple Vitamins-Minerals (PRESERVISION AREDS 2 PO) Take by mouth.    [provider]  nitroGLYCERIN (NITROSTAT)  0.4 MG SL tablet 1 tablet under tongue for chest pain. May repeat q 5 min x 3. If not improved, call 911. 09/29/19   [provider]  predniSONE (DELTASONE) 10 MG tablet Take 6 tabs p.o. on day 1 and decrease by 1 tablet daily until complete 08/07/21   Eusebio Friendly B, PA-C  valsartan (DIOVAN) 40 MG tablet Take 40 mg by mouth at bedtime.    [provider]    Family History Family History  Problem Relation Age of Onset   Clotting disorder Mother    Heart failure Father     Social History Social History   Tobacco Use   Smoking status: Never   Smokeless tobacco: Never  Vaping Use   Vaping status: Never Used  Substance Use Topics   Alcohol use: No   Drug use: No     Allergies   Sulfa antibiotics, Codeine, Hydrocodone-acetaminophen, and Propoxyphene   Review of Systems Review of Systems  Constitutional:  Negative for fever.  HENT:  Positive for congestion, postnasal drip, sinus pressure, sinus pain and sneezing.   All other systems reviewed and are negative.    Physical Exam Triage Vital Signs ED Triage Vitals [02/13/23 0816]  Encounter Vitals Group     BP      Systolic BP Percentile      Diastolic BP Percentile      Pulse      Resp  Temp      Temp src      SpO2      Weight 145 lb 1 oz (65.8 kg)     Height 5\' 7"  (1.702 m)     Head Circumference      Peak Flow      Pain Score 0     Pain Loc      Pain Education      Exclude from Growth Chart    No data found.  Updated Vital Signs BP 138/73 (BP Location: Left Arm)   Pulse 80   Temp 98.6 F (37 C) (Oral)   Resp 14   Ht 5\' 7"  (1.702 m)   Wt 145 lb 1 oz (65.8 kg)   SpO2 97%   BMI 22.72 kg/m   Visual Acuity Right Eye Distance:   Left Eye Distance:   Bilateral Distance:    Right Eye Near:   Left Eye Near:    Bilateral Near:     Physical Exam Vitals and nursing note reviewed.  Constitutional:      General: She is not in acute distress.    Appearance: She is well-developed.   HENT:     Head: Normocephalic.     Right Ear: Tympanic membrane is retracted.     Left Ear: Tympanic membrane is retracted.     Nose: Congestion present.     Right Sinus: Maxillary sinus tenderness present.     Left Sinus: Maxillary sinus tenderness present.     Mouth/Throat:     Lips: Pink.     Mouth: Mucous membranes are moist.     Pharynx: Oropharynx is clear.  Eyes:     General: Lids are normal.     Conjunctiva/sclera: Conjunctivae normal.     Pupils: Pupils are equal, round, and reactive to light.  Neck:     Trachea: No tracheal deviation.  Cardiovascular:     Rate and Rhythm: Normal rate and regular rhythm.     Pulses: Normal pulses.     Heart sounds: Normal heart sounds. No murmur heard. Pulmonary:     Effort: Pulmonary effort is normal.     Breath sounds: Normal breath sounds and air entry.  Abdominal:     General: Bowel sounds are normal.     Palpations: Abdomen is soft.     Tenderness: There is no abdominal tenderness.  Musculoskeletal:        General: Normal range of motion.     Cervical back: Normal range of motion.  Lymphadenopathy:     Cervical: No cervical adenopathy.  Skin:    General: Skin is warm and dry.     Findings: No rash.  Neurological:     General: No focal deficit present.     Mental Status: She is alert and oriented to person, place, and time.     GCS: GCS eye subscore is 4. GCS verbal subscore is 5. GCS motor subscore is 6.  Psychiatric:        Attention and Perception: Attention normal.        Mood and Affect: Mood normal.        Speech: Speech normal.        Behavior: Behavior normal. Behavior is cooperative.      UC Treatments / Results  Labs (all labs ordered are listed, but only abnormal results are displayed) Labs Reviewed - No data to display  EKG   Radiology No results found.  Procedures Procedures (including critical care time)  Medications Ordered in UC Medications - No data to display  Initial Impression /  Assessment and Plan / UC Course  I have reviewed the triage vital signs and the nursing notes.  Pertinent labs & imaging results that were available during my care of the patient were reviewed by me and considered in my medical decision making (see chart for details). Discussed exam findings and plan of care with patient, strict go to ER precautions given.   Patient verbalized understanding to this provider.   Ddx: Acute uri, maxillary sinusitis, allergies Final Clinical Impressions(s) / UC Diagnoses   Final diagnoses:  URI, acute     Discharge Instructions      Rest,push fluids,take antibiotic as directed(doxycycline). Follow up with PCP. May take OTC meds for symptom management (flonase, afrin, tylenol,ibuprofen,chloraseptic, etc). Return as needed     ED Prescriptions   None    PDMP not reviewed this encounter.   Clancy Gourd, NP 02/13/23 [phone]

## 2023-02-13 NOTE — ED Triage Notes (Signed)
[place]  Patient c/o sinus congestion and pressure, and nasal congestion for the past 10 days.  Patient denies fevers.

## 2023-02-14 IMAGING — CR DG FOOT COMPLETE 3+V*R*
3 series · 3 of 3 positions shown · non-contrast
Comparison: None.

CLINICAL DATA: Heel pain and swelling.  Some redness in the area.

EXAM:
RIGHT FOOT COMPLETE - 3+ VIEW

[foot ap]
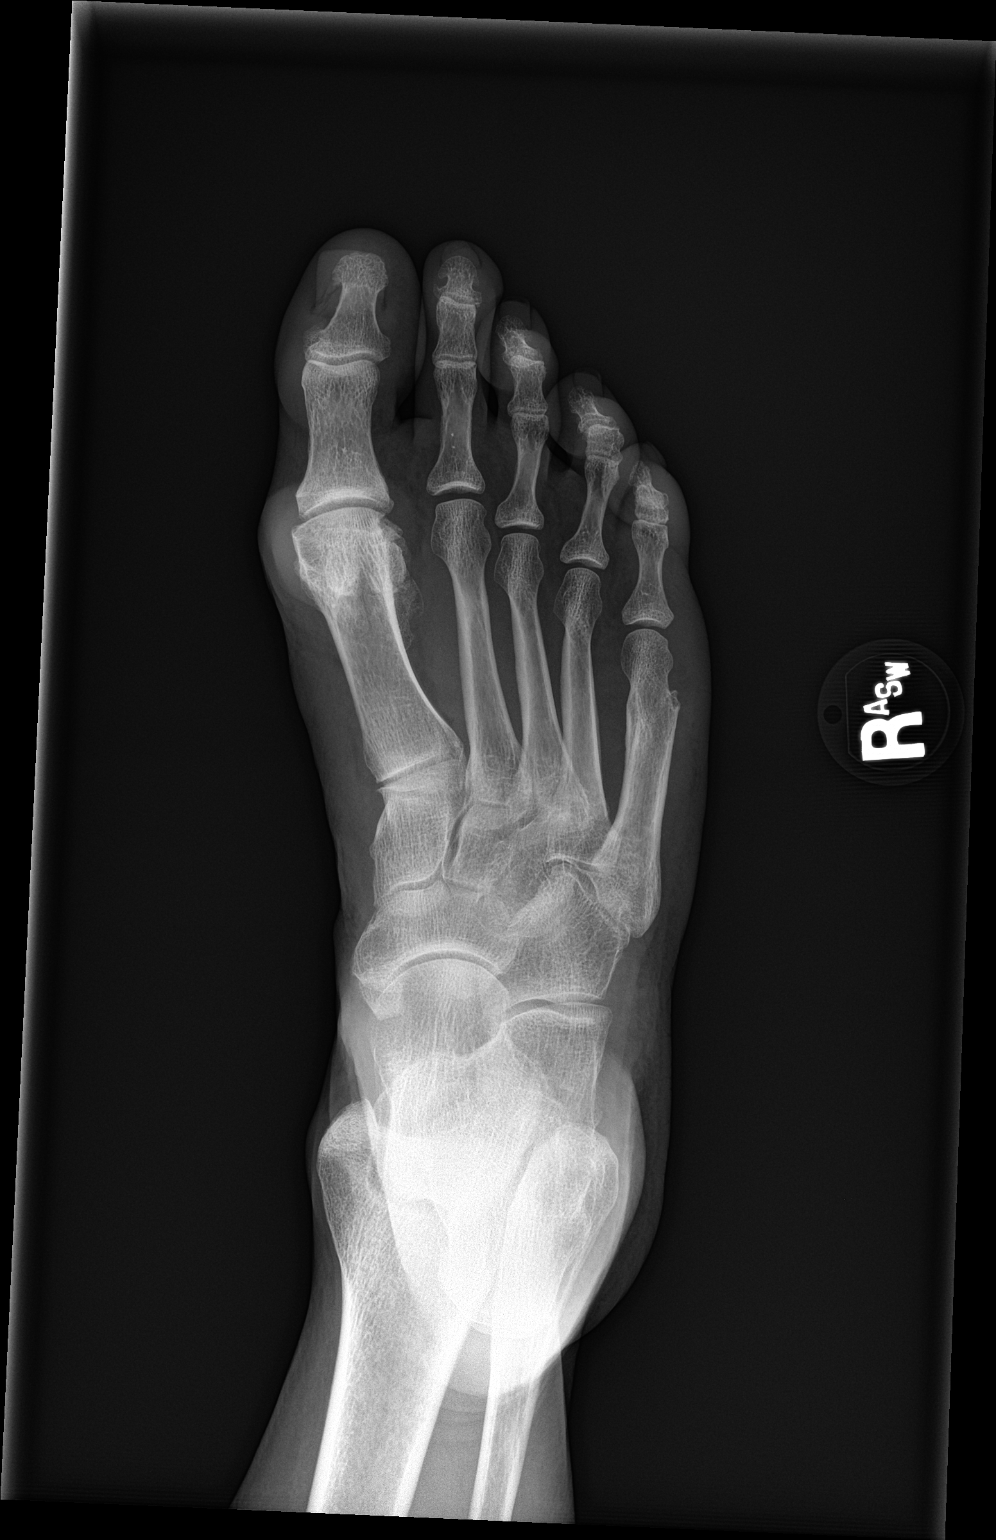

[foot obl]
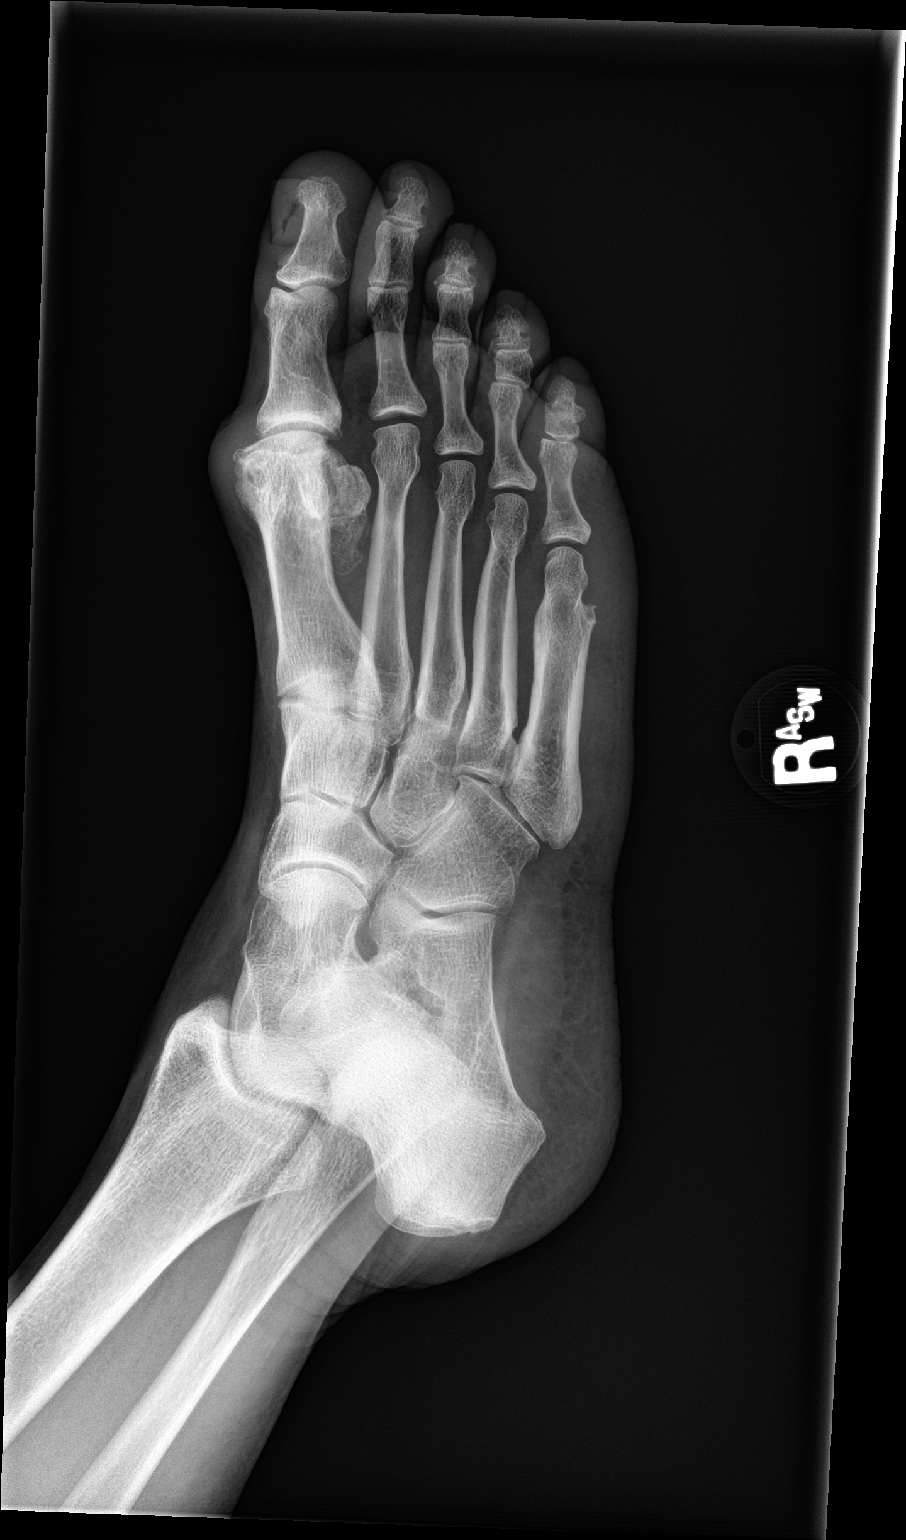

[foot lat]
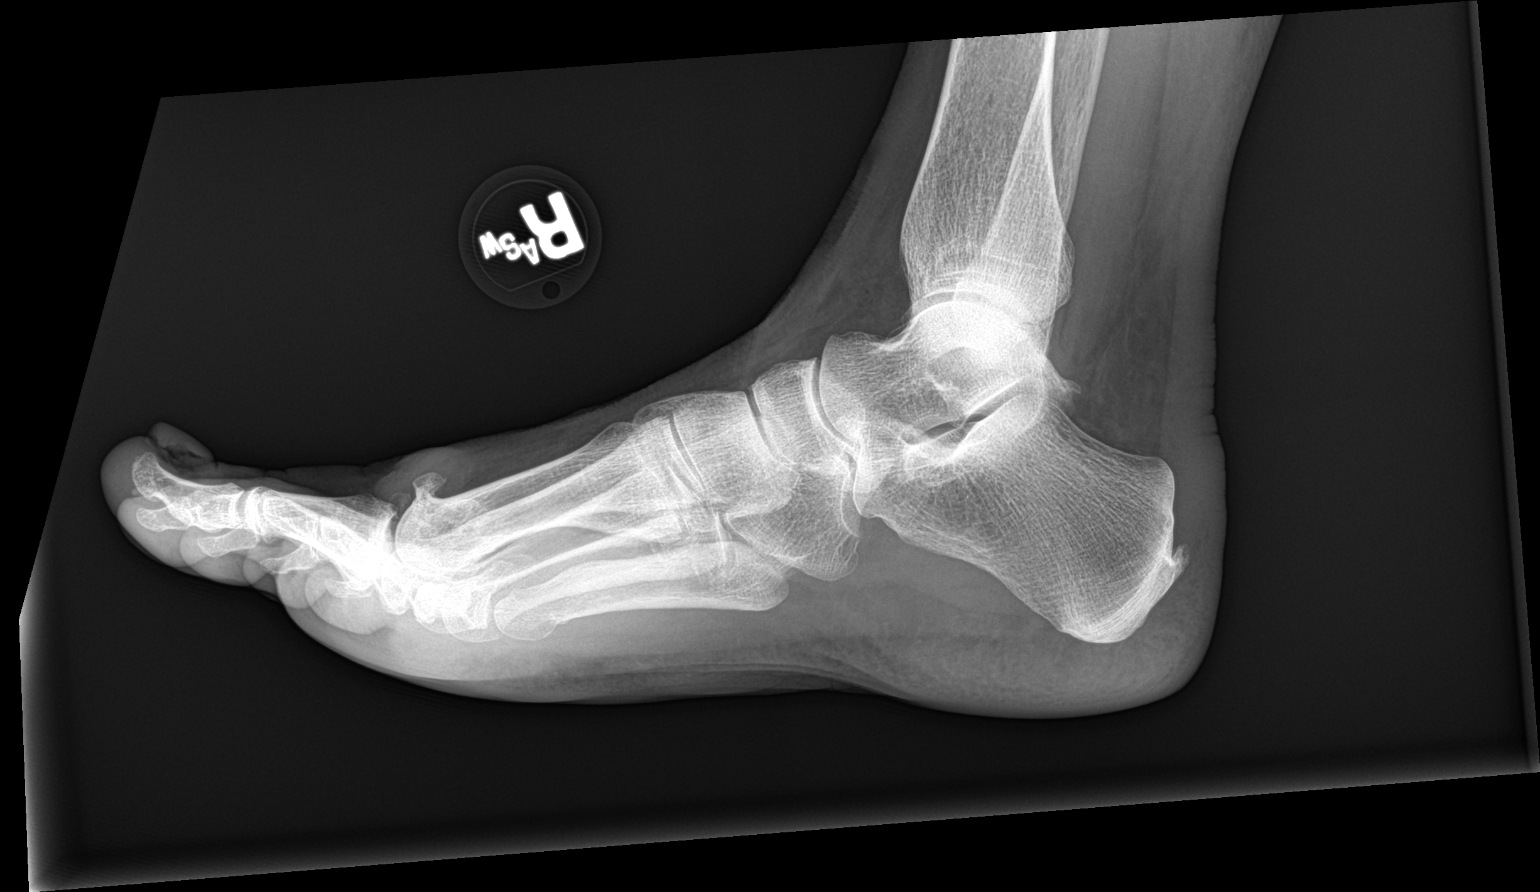

[3 of 3 positions shown; findings below may reference images not displayed]

FINDINGS: Mild great toe metatarsophalangeal joint space narrowing with
moderate dorsal and mild medial and lateral metatarsal head
degenerative osteophytosis. Mild chronic spur at the Achilles
insertion on the calcaneus.

Old healed fracture of the distal shaft of fifth metatarsal.

No cortical erosion.  No acute fracture or dislocation.
IMPRESSION: :
IMPRESSION: 1. Mild chronic spurring at the Achilles insertion on the calcaneus.
2. Mild-to-moderate great toe metatarsophalangeal joint
osteoarthritis.

## 2024-04-11 ENCOUNTER — Ambulatory Visit
Admission: EM | Admit: 2024-04-11 | Discharge: 2024-04-11 | Disposition: A | Discharge status: Home / Self Care | Attending: Family Medicine | Admitting: Family Medicine

## 2024-04-11 ENCOUNTER — Encounter: Payer: Self-pay | Admitting: Emergency Medicine

## 2024-04-11 DIAGNOSIS — M25511 Pain in right shoulder: Secondary | ICD-10-CM | POA: Diagnosis not present

## 2024-04-11 MED ORDER — PREDNISONE 10 MG (21) PO TBPK: ORAL_TABLET | Freq: Every day | ORAL | 0 refills | Status: AC

## 2024-04-11 MED ORDER — TRAMADOL HCL 50 MG PO TABS: 50.0000 mg | ORAL_TABLET | Freq: Four times a day (QID) | ORAL | 0 refills | Status: AC | PRN

## 2024-04-11 MED ORDER — METHOCARBAMOL 500 MG PO TABS: 500.0000 mg | ORAL_TABLET | Freq: Two times a day (BID) | ORAL | 0 refills | Status: AC

## 2024-04-11 NOTE — Discharge Instructions (Addendum)
 Stop by the pharmacy to pick up your prescriptions.  Follow up with your primary care provider or return to the urgent care, if not improving.

## 2024-04-11 NOTE — ED Triage Notes (Signed)
 Pt presents with right shoulder blade pain x 9 days. Pt states the pain is constant and at times the pain goes into her neck and underneath her arm. Patient denies any injury. She takes tylenol for the pain.

## 2024-04-11 NOTE — ED Provider Notes (Signed)
 [facility]    CSN: [id number] Arrival date & time: 04/11/24  0909      History   Chief Complaint Chief Complaint  Patient presents with   Shoulder Pain    HPI  HPI Tiffany Mcconnell is a 68 y.o. female.   Tiffany Mcconnell presents for right posterior shoulder pain that radiates up her neck and down her arm. Taking pain medication (Tylenol 650 mg) which takes the edge off.  She had similar sx previously that went away with time. She is not sleeping well due to the pain. She is not able ibuprofen due to her heart issue. No falls or trauma. A week ago she was drying off after getting out of the shower and felt something.  Notes that she has been under a lot of stress recently.      Past Medical History:  Diagnosis Date   Non-STEMI (non-ST elevated myocardial infarction) [facility])     Patient Active Problem List   Diagnosis Date Noted   URI, acute 02/13/2023    Past Surgical History:  Procedure Laterality Date   APPENDECTOMY     CATARACT EXTRACTION W/PHACO Left 08/18/2018   Procedure: CATARACT EXTRACTION PHACO AND INTRAOCULAR LENS PLACEMENT (IOC)  LEFT;  Surgeon: Mittie Gaskin, MD;  Location: [facility];  Service: Ophthalmology;  Laterality: Left;   CATARACT EXTRACTION W/PHACO Right 11/17/2018   Procedure: CATARACT EXTRACTION PHACO AND INTRAOCULAR LENS PLACEMENT (IOC) RIGHT;  Surgeon: Mittie Gaskin, MD;  Location: [facility];  Service: Ophthalmology;  Laterality: Right;   COLONOSCOPY     EYE SURGERY     eyelid   FOOT SURGERY Left     OB History   No obstetric history on file.      Home Medications    Prior to Admission medications   Medication Sig Start Date End Date Taking? Authorizing Provider  methocarbamol (ROBAXIN) 500 MG tablet Take 1 tablet (500 mg total) by mouth 2 (two) times daily. 04/11/24  Yes Patrik Turnbaugh, DO  predniSONE  (STERAPRED UNI-PAK 21 TAB) 10 MG (21) TBPK tablet Take by mouth daily. Take 6 tabs by mouth daily  for 1, then 5 tabs for 1 day, then 4 tabs for 1 day, then 3 tabs for 1 day, then 2 tabs for 1 day, then 1 tab for 1 day. 04/11/24  Yes Marylan Glore, DO  traMADol (ULTRAM) 50 MG tablet Take 1 tablet (50 mg total) by mouth every 6 (six) hours as needed. 04/11/24  Yes Faline Langer, DO  aspirin 81 MG EC tablet Take by mouth. 09/11/20   [provider]  atorvastatin (LIPITOR) 80 MG tablet Take by mouth. 09/11/20   [provider]  metoprolol succinate (TOPROL-XL) 25 MG 24 hr tablet Take 0.5 tablets by mouth daily. 09/13/19   [provider]  Multiple Vitamin (MULTIVITAMIN) tablet Take 1 tablet by mouth daily.    [provider]  Multiple Vitamins-Minerals (PRESERVISION AREDS 2 PO) Take by mouth.    [provider]  nitroGLYCERIN (NITROSTAT) 0.4 MG SL tablet 1 tablet under tongue for chest pain. May repeat q 5 min x 3. If not improved, call 911. 09/29/19   [provider]  valsartan (DIOVAN) 40 MG tablet Take 40 mg by mouth at bedtime.    [provider]    Family History Family History  Problem Relation Age of Onset   Clotting disorder Mother    Heart failure Father     Social History Social History   Tobacco  Use   Smoking status: Never   Smokeless tobacco: Never  Vaping Use   Vaping status: Never Used  Substance Use Topics   Alcohol use: No   Drug use: No     Allergies   Sulfa antibiotics, Codeine, Hydrocodone-acetaminophen, and Propoxyphene   Review of Systems Review of Systems: :negative unless otherwise stated in HPI.      Physical Exam Triage Vital Signs ED Triage Vitals  Encounter Vitals Group     BP 04/11/24 0945 (!) 118/97     Girls Systolic BP Percentile --      Girls Diastolic BP Percentile --      Boys Systolic BP Percentile --      Boys Diastolic BP Percentile --      Pulse Rate 04/11/24 0945 67     Resp 04/11/24 0945 16     Temp 04/11/24 0945 97.8 F (36.6 C)     Temp Source 04/11/24 0945  Oral     SpO2 04/11/24 0945 100 %     Weight 04/11/24 0941 151 lb (68.5 kg)     Height --      Head Circumference --      Peak Flow --      Pain Score 04/11/24 0943 7     Pain Loc --      Pain Education --      Exclude from Growth Chart --    No data found.  Updated Vital Signs BP (!) 118/97 (BP Location: Left Arm)   Pulse 67   Temp 97.8 F (36.6 C) (Oral)   Resp 16   Wt 68.5 kg   SpO2 100%   BMI 23.65 kg/m   Visual Acuity Right Eye Distance:   Left Eye Distance:   Bilateral Distance:    Right Eye Near:   Left Eye Near:    Bilateral Near:     Physical Exam GEN: well appearing female in no acute distress  CVS: well perfused  RESP: speaking in full sentences without pause, no respiratory distress  MSK:   Right shoulder:  No evidence of bony deformity, asymmetry, or muscle atrophy. No tenderness over long head of biceps (bicipital groove).  No TTP at [facility] joint.  Multiple tender points palpable in the trapezius, serratus and rhomboid on the right  Full active and passive (ABD, ADD, Flexion, extension, IR, ER).  Strength 5/5 shoulder.  No abnormal scapular function observed.  Special Tests: Neer's: Negative; Painful arc: Negative; Anterior Apprehension: Negative Sensation intact. Peripheral pulses intact.   UC Treatments / Results  Labs (all labs ordered are listed, but only abnormal results are displayed) Labs Reviewed - No data to display  EKG   Radiology No results found.   Procedures Procedures (including critical care time)  Medications Ordered in UC Medications - No data to display  Initial Impression / Assessment and Plan / UC Course  I have reviewed the triage vital signs and the nursing notes.  Pertinent labs & imaging results that were available during my care of the patient were reviewed by me and considered in my medical decision making (see chart for details).      Pt is a 68 y.o.  female with 1-2 weeks of right shoulder pain after  drying off after a shower.   On exam, pt has multiple musclular tender points concerning for muscular strain.   Deferred shoulder imaging as doubt fracture or dislocation.   Patient to gradually return to normal activities, as tolerated and continue  ordinary activities within the limits permitted by pain. Prednisone  and muscle relaxer for mild to moderate pain. Tramadol for moderately severe pain. Continue Tylenol PRN. Advised patient to avoid OTC NSAIDs while taking prescription NSAID.   Patient to follow up with orthopedic provider, if symptoms do not improve with conservative treatment.  Return and ED precautions given. Understanding voiced. Discussed MDM, treatment plan and plan for follow-up with patient  who agrees with plan.   Final Clinical Impressions(s) / UC Diagnoses   Final diagnoses:  Acute pain of right shoulder     Discharge Instructions      Stop by the pharmacy to pick up your prescriptions.  Follow up with your primary care provider or return to the urgent care, if not improving.       ED Prescriptions     Medication Sig Dispense Auth. Provider   predniSONE  (STERAPRED UNI-PAK 21 TAB) 10 MG (21) TBPK tablet Take by mouth daily. Take 6 tabs by mouth daily for 1, then 5 tabs for 1 day, then 4 tabs for 1 day, then 3 tabs for 1 day, then 2 tabs for 1 day, then 1 tab for 1 day. 21 tablet Mayte Diers, DO   methocarbamol (ROBAXIN) 500 MG tablet Take 1 tablet (500 mg total) by mouth 2 (two) times daily. 20 tablet Euclid Cassetta, DO   traMADol (ULTRAM) 50 MG tablet Take 1 tablet (50 mg total) by mouth every 6 (six) hours as needed. 10 tablet Alece Koppel, DO      I have reviewed the PDMP during this encounter.   Kriste Berth, DO 04/11/24 1222
